# Patient Record
Sex: Female | Born: 1960 | Race: White | Hispanic: No | Marital: Single | Smoking: Current every day smoker
Health system: Southern US, Community
[De-identification: ages and names within clinical notes are randomized; demographics above are authoritative.]

## PROBLEM LIST (undated history)

## (undated) DIAGNOSIS — I10 Essential (primary) hypertension: Secondary | ICD-10-CM

## (undated) DIAGNOSIS — I639 Cerebral infarction, unspecified: Secondary | ICD-10-CM

## (undated) DIAGNOSIS — F1410 Cocaine abuse, uncomplicated: Secondary | ICD-10-CM

## (undated) DIAGNOSIS — I219 Acute myocardial infarction, unspecified: Secondary | ICD-10-CM

## (undated) DIAGNOSIS — I739 Peripheral vascular disease, unspecified: Secondary | ICD-10-CM

## (undated) DIAGNOSIS — K559 Vascular disorder of intestine, unspecified: Secondary | ICD-10-CM

## (undated) DIAGNOSIS — I2510 Atherosclerotic heart disease of native coronary artery without angina pectoris: Secondary | ICD-10-CM

## (undated) DIAGNOSIS — I509 Heart failure, unspecified: Secondary | ICD-10-CM

## (undated) HISTORY — PX: EXPLORATORY LAPAROTOMY: SUR591

---

## 2014-08-10 ENCOUNTER — Inpatient Hospital Stay
Admission: AD | Admit: 2014-08-10 | Discharge: 2014-08-11 | Payer: Medicare Other | Source: Ambulatory Visit | Attending: Internal Medicine | Admitting: Internal Medicine

## 2014-08-10 DIAGNOSIS — Z789 Other specified health status: Secondary | ICD-10-CM

## 2014-08-10 DIAGNOSIS — K668 Other specified disorders of peritoneum: Secondary | ICD-10-CM

## 2014-08-11 ENCOUNTER — Inpatient Hospital Stay
Admission: RE | Admit: 2014-08-11 | Discharge: 2014-08-26 | Disposition: A | Discharge status: Nursing Home (Includes ICF) | Payer: Medicare Other | Attending: Emergency Medicine | Admitting: Emergency Medicine

## 2014-08-11 ENCOUNTER — Emergency Department (HOSPITAL_COMMUNITY): Payer: Medicare Other

## 2014-08-11 ENCOUNTER — Encounter (HOSPITAL_COMMUNITY): Payer: Self-pay | Admitting: Emergency Medicine

## 2014-08-11 ENCOUNTER — Other Ambulatory Visit (HOSPITAL_COMMUNITY): Payer: Medicare Other

## 2014-08-11 ENCOUNTER — Emergency Department (HOSPITAL_COMMUNITY)
Admission: EM | Admit: 2014-08-11 | Discharge: 2014-08-11 | Disposition: A | Discharge status: Rehabilitation Facility | Payer: Medicare Other | Attending: Emergency Medicine | Admitting: Emergency Medicine

## 2014-08-11 DIAGNOSIS — J9600 Acute respiratory failure, unspecified whether with hypoxia or hypercapnia: Secondary | ICD-10-CM

## 2014-08-11 DIAGNOSIS — Z730 Burn-out: Secondary | ICD-10-CM | POA: Insufficient documentation

## 2014-08-11 DIAGNOSIS — Z9889 Other specified postprocedural states: Secondary | ICD-10-CM | POA: Insufficient documentation

## 2014-08-11 DIAGNOSIS — K668 Other specified disorders of peritoneum: Secondary | ICD-10-CM | POA: Insufficient documentation

## 2014-08-11 DIAGNOSIS — J9690 Respiratory failure, unspecified, unspecified whether with hypoxia or hypercapnia: Secondary | ICD-10-CM

## 2014-08-11 DIAGNOSIS — I2510 Atherosclerotic heart disease of native coronary artery without angina pectoris: Secondary | ICD-10-CM | POA: Diagnosis not present

## 2014-08-11 DIAGNOSIS — G931 Anoxic brain damage, not elsewhere classified: Secondary | ICD-10-CM

## 2014-08-11 DIAGNOSIS — G40901 Epilepsy, unspecified, not intractable, with status epilepticus: Secondary | ICD-10-CM

## 2014-08-11 DIAGNOSIS — K56609 Unspecified intestinal obstruction, unspecified as to partial versus complete obstruction: Secondary | ICD-10-CM

## 2014-08-11 DIAGNOSIS — R630 Anorexia: Secondary | ICD-10-CM | POA: Diagnosis not present

## 2014-08-11 DIAGNOSIS — J111 Influenza due to unidentified influenza virus with other respiratory manifestations: Secondary | ICD-10-CM

## 2014-08-11 DIAGNOSIS — G8918 Other acute postprocedural pain: Secondary | ICD-10-CM | POA: Diagnosis present

## 2014-08-11 DIAGNOSIS — Z8673 Personal history of transient ischemic attack (TIA), and cerebral infarction without residual deficits: Secondary | ICD-10-CM | POA: Diagnosis not present

## 2014-08-11 DIAGNOSIS — Z452 Encounter for adjustment and management of vascular access device: Secondary | ICD-10-CM

## 2014-08-11 DIAGNOSIS — I10 Essential (primary) hypertension: Secondary | ICD-10-CM | POA: Diagnosis not present

## 2014-08-11 DIAGNOSIS — I509 Heart failure, unspecified: Secondary | ICD-10-CM | POA: Insufficient documentation

## 2014-08-11 DIAGNOSIS — Z95828 Presence of other vascular implants and grafts: Secondary | ICD-10-CM

## 2014-08-11 DIAGNOSIS — I469 Cardiac arrest, cause unspecified: Secondary | ICD-10-CM

## 2014-08-11 DIAGNOSIS — Z4659 Encounter for fitting and adjustment of other gastrointestinal appliance and device: Secondary | ICD-10-CM

## 2014-08-11 DIAGNOSIS — Z01818 Encounter for other preprocedural examination: Secondary | ICD-10-CM

## 2014-08-11 HISTORY — DX: Peripheral vascular disease, unspecified: I73.9

## 2014-08-11 HISTORY — DX: Heart failure, unspecified: I50.9

## 2014-08-11 HISTORY — DX: Cocaine abuse, uncomplicated: F14.10

## 2014-08-11 HISTORY — DX: Atherosclerotic heart disease of native coronary artery without angina pectoris: I25.10

## 2014-08-11 HISTORY — DX: Vascular disorder of intestine, unspecified: K55.9

## 2014-08-11 HISTORY — DX: Cerebral infarction, unspecified: I63.9

## 2014-08-11 HISTORY — DX: Essential (primary) hypertension: I10

## 2014-08-11 LAB — PROTIME-INR
INR: 1.12 (ref 0.00–1.49)
Prothrombin Time: 14.5 seconds (ref 11.6–15.2)

## 2014-08-11 LAB — CBC WITH DIFFERENTIAL/PLATELET
BASOS ABS: 0 10*3/uL (ref 0.0–0.1)
BASOS PCT: 0 % (ref 0–1)
Basophils Absolute: 0 10*3/uL (ref 0.0–0.1)
Basophils Relative: 0 % (ref 0–1)
Eosinophils Absolute: 0 10*3/uL (ref 0.0–0.7)
Eosinophils Absolute: 0 10*3/uL (ref 0.0–0.7)
Eosinophils Relative: 0 % (ref 0–5)
Eosinophils Relative: 0 % (ref 0–5)
HCT: 29.9 % — ABNORMAL LOW (ref 36.0–46.0)
HCT: 24.1 % — ABNORMAL LOW (ref 36.0–46.0)
Hemoglobin: 9.9 g/dL — ABNORMAL LOW (ref 12.0–15.0)
Hemoglobin: 8.2 g/dL — ABNORMAL LOW (ref 12.0–15.0)
LYMPHS PCT: 9 % — AB (ref 12–46)
Lymphocytes Relative: 11 % — ABNORMAL LOW (ref 12–46)
Lymphs Abs: 1.1 10*3/uL (ref 0.7–4.0)
Lymphs Abs: 1.1 10*3/uL (ref 0.7–4.0)
MCH: 29.3 pg (ref 26.0–34.0)
MCH: 30.3 pg (ref 26.0–34.0)
MCHC: 33.1 g/dL (ref 30.0–36.0)
MCHC: 34 g/dL (ref 30.0–36.0)
MCV: 88.5 fL (ref 78.0–100.0)
MCV: 88.9 fL (ref 78.0–100.0)
MONO ABS: 0.4 10*3/uL (ref 0.1–1.0)
Monocytes Absolute: 0.5 10*3/uL (ref 0.1–1.0)
Monocytes Relative: 3 % (ref 3–12)
Monocytes Relative: 5 % (ref 3–12)
NEUTROS ABS: 10.8 10*3/uL — AB (ref 1.7–7.7)
Neutro Abs: 7.8 10*3/uL — ABNORMAL HIGH (ref 1.7–7.7)
Neutrophils Relative %: 88 % — ABNORMAL HIGH (ref 43–77)
Neutrophils Relative %: 83 % — ABNORMAL HIGH (ref 43–77)
PLATELETS: 403 10*3/uL — AB (ref 150–400)
Platelets: 353 10*3/uL (ref 150–400)
RBC: 3.38 MIL/uL — ABNORMAL LOW (ref 3.87–5.11)
RBC: 2.71 MIL/uL — ABNORMAL LOW (ref 3.87–5.11)
RDW: 14.2 % (ref 11.5–15.5)
RDW: 14.3 % (ref 11.5–15.5)
WBC: 12.3 10*3/uL — ABNORMAL HIGH (ref 4.0–10.5)
WBC: 9.4 10*3/uL (ref 4.0–10.5)

## 2014-08-11 LAB — COMPREHENSIVE METABOLIC PANEL
ALBUMIN: 1.5 g/dL — AB (ref 3.5–5.2)
ALT: 28 U/L (ref 0–35)
ALT: 25 U/L (ref 0–35)
AST: 25 U/L (ref 0–37)
AST: 25 U/L (ref 0–37)
Albumin: 1.3 g/dL — ABNORMAL LOW (ref 3.5–5.2)
Alkaline Phosphatase: 48 U/L (ref 39–117)
Alkaline Phosphatase: 42 U/L (ref 39–117)
Anion gap: 12 (ref 5–15)
Anion gap: 11 (ref 5–15)
BUN: 15 mg/dL (ref 6–23)
BUN: 14 mg/dL (ref 6–23)
CO2: 25 meq/L (ref 19–32)
CO2: 24 mEq/L (ref 19–32)
CREATININE: 0.77 mg/dL (ref 0.50–1.10)
Calcium: 7.9 mg/dL — ABNORMAL LOW (ref 8.4–10.5)
Calcium: 7.5 mg/dL — ABNORMAL LOW (ref 8.4–10.5)
Chloride: 101 mEq/L (ref 96–112)
Chloride: 102 mEq/L (ref 96–112)
Creatinine, Ser: 0.69 mg/dL (ref 0.50–1.10)
GFR calc Af Amer: >90 mL/min (ref >90)
GFR calc Af Amer: >90 mL/min (ref >90)
GFR calc non Af Amer: >90 mL/min (ref >90)
Glucose, Bld: 103 mg/dL — ABNORMAL HIGH (ref 70–99)
Glucose, Bld: 68 mg/dL — ABNORMAL LOW (ref 70–99)
Potassium: 2.8 mEq/L — CL (ref 3.7–5.3)
Potassium: 2.9 mEq/L — CL (ref 3.7–5.3)
SODIUM: 138 meq/L (ref 137–147)
Sodium: 137 mEq/L (ref 137–147)
Total Bilirubin: 0.3 mg/dL (ref 0.3–1.2)
Total Bilirubin: 0.3 mg/dL (ref 0.3–1.2)
Total Protein: 4.6 g/dL — ABNORMAL LOW (ref 6.0–8.3)
Total Protein: 4.2 g/dL — ABNORMAL LOW (ref 6.0–8.3)

## 2014-08-11 LAB — VITAMIN B12: Vitamin B-12: 834 pg/mL (ref 211–911)

## 2014-08-11 LAB — LIPID PANEL
CHOL/HDL RATIO: 4.3 ratio
Cholesterol: 82 mg/dL (ref 0–200)
HDL: 19 mg/dL — ABNORMAL LOW (ref >39)
LDL Cholesterol: 39 mg/dL (ref 0–99)
Triglycerides: 121 mg/dL (ref <150)
VLDL: 24 mg/dL (ref 0–40)

## 2014-08-11 LAB — IRON AND TIBC
Iron: <10 ug/dL — ABNORMAL LOW (ref 42–135)
UIBC: 134 ug/dL (ref 125–400)

## 2014-08-11 LAB — T4, FREE: FREE T4: 0.96 ng/dL (ref 0.80–1.80)

## 2014-08-11 LAB — PHOSPHORUS: Phosphorus: 3.2 mg/dL (ref 2.3–4.6)

## 2014-08-11 LAB — BLOOD GAS, ARTERIAL
Acid-Base Excess: 2.5 mmol/L — ABNORMAL HIGH (ref 0.0–2.0)
BICARBONATE: 25 meq/L — AB (ref 20.0–24.0)
DRAWN BY: 27022
O2 CONTENT: 4 L/min
O2 SAT: 96.7 %
PCO2 ART: 29 mmHg — AB (ref 35.0–45.0)
Patient temperature: 98.6
TCO2: 25.9 mmol/L (ref 0–100)
pH, Arterial: 7.545 — ABNORMAL HIGH (ref 7.350–7.450)
pO2, Arterial: 94.8 mmHg (ref 80.0–100.0)

## 2014-08-11 LAB — MAGNESIUM: MAGNESIUM: 1.7 mg/dL (ref 1.5–2.5)

## 2014-08-11 LAB — FERRITIN: Ferritin: 181 ng/mL (ref 10–291)

## 2014-08-11 LAB — PRO B NATRIURETIC PEPTIDE: Pro B Natriuretic peptide (BNP): 3495 pg/mL — ABNORMAL HIGH (ref 0–125)

## 2014-08-11 LAB — TSH: TSH: 4.57 u[IU]/mL — AB (ref 0.350–4.500)

## 2014-08-11 LAB — HEMOGLOBIN A1C
Hgb A1c MFr Bld: 5.5 % (ref <5.7)
Mean Plasma Glucose: 111 mg/dL (ref <117)

## 2014-08-11 MED ORDER — FENTANYL CITRATE 0.05 MG/ML IJ SOLN: 100.0000 ug | Freq: Once | INTRAMUSCULAR | Status: DC

## 2014-08-11 MED ORDER — ONDANSETRON HCL 4 MG/2ML IJ SOLN
4.0000 mg | Freq: Once | INTRAMUSCULAR | Status: AC
  Administered 2014-08-11: 4 mg via INTRAVENOUS
  Filled 2014-08-11: qty 2

## 2014-08-11 MED ORDER — POTASSIUM CHLORIDE 10 MEQ/100ML IV SOLN
10.0000 meq | INTRAVENOUS | Status: AC
  Administered 2014-08-11 (×3): 10 meq via INTRAVENOUS
  Filled 2014-08-11 (×4): qty 100

## 2014-08-11 MED ORDER — MORPHINE SULFATE 4 MG/ML IJ SOLN
4.0000 mg | Freq: Once | INTRAMUSCULAR | Status: AC
  Administered 2014-08-11: 4 mg via INTRAVENOUS
  Filled 2014-08-11: qty 1

## 2014-08-11 MED ORDER — SODIUM CHLORIDE 0.9 % IV BOLUS (SEPSIS)
500.0000 mL | Freq: Once | INTRAVENOUS | Status: AC
  Administered 2014-08-11: 500 mL via INTRAVENOUS

## 2014-08-11 NOTE — ED Notes (Signed)
RN to bedside to round, noticed pt gown wet. Upon inspection, found CVC catheter lying on pt chest. Sutures intact, line intact, dressing missing. Pt unaware of pulling line out. MD made aware. No active bleeding noted. Pt denies any pain.

## 2014-08-11 NOTE — Consult Note (Signed)
Reason for Consult:recent ex lap at outside hospital and free air on CT scan Referring Physician: EDP  Alyssa Gray is an 53 y.o. female.  HPI: Asked to see pt sent from [facility] after laparotomy done 1 week ago at outside hospital.  No communication from [facility] to surgery here and pt transferred to ED without accepting MD due to free air on an admission abdominal film.  Pt had surgery at [facility] but no records to review.  CT ordered by [facility] of abdomen and pelvis which shows free air but no contrast and poor quality.  Pt is a poor historian and denies abdominal pain and is hungry. She cannot give me any history and has no idea what was done to her abdomen. Pt denies abdominal pain nausea or vomiting.   Past Medical History  Diagnosis Date  . Coronary artery disease   . CHF (congestive heart failure)   . Hypertension   . Cocaine abuse   . Stroke     TIAs  . Mesenteric ischemia   . Peripheral arterial disease     History reviewed. No pertinent past surgical history.  History reviewed. No pertinent family history.  Social History:  reports that she has been smoking.  She does not have any smokeless tobacco history on file. She reports that she drinks alcohol. She reports that she uses illicit drugs (Cocaine).  Allergies: No Known Allergies  Medications: I have reviewed the patient's current medications.  Results for orders placed or performed during the hospital encounter of 08/11/14 (from the past 48 hour(s))  CBC with Differential     Status: Abnormal   Collection Time: 08/11/14  4:20 PM  Result Value Ref Range   WBC 9.4 4.0 - 10.5 K/uL   RBC 2.71 (L) 3.87 - 5.11 MIL/uL   Hemoglobin 8.2 (L) 12.0 - 15.0 g/dL    Comment: DELTA CHECK NOTED REPEATED TO VERIFY    HCT 24.1 (L) 36.0 - 46.0 %   MCV 88.9 78.0 - 100.0 fL   MCH 30.3 26.0 - 34.0 pg   MCHC 34.0 30.0 - 36.0 g/dL   RDW 14.3 11.5 - 15.5 %   Platelets 353 150 - 400 K/uL   Neutrophils  Relative % 83 (H) 43 - 77 %   Neutro Abs 7.8 (H) 1.7 - 7.7 K/uL   Lymphocytes Relative 11 (L) 12 - 46 %   Lymphs Abs 1.1 0.7 - 4.0 K/uL   Monocytes Relative 5 3 - 12 %   Monocytes Absolute 0.5 0.1 - 1.0 K/uL   Eosinophils Relative 0 0 - 5 %   Eosinophils Absolute 0.0 0.0 - 0.7 K/uL   Basophils Relative 0 0 - 1 %   Basophils Absolute 0.0 0.0 - 0.1 K/uL  Comprehensive metabolic panel     Status: Abnormal   Collection Time: 08/11/14  4:20 PM  Result Value Ref Range   Sodium 137 137 - 147 mEq/L   Potassium 2.9 (LL) 3.7 - 5.3 mEq/L    Comment: CRITICAL RESULT CALLED TO, READ BACK BY AND VERIFIED WITH: G [facility] 1718 08/11/14 WBOND    Chloride 102 96 - 112 mEq/L   CO2 24 19 - 32 mEq/L   Glucose, Bld 68 (L) 70 - 99 mg/dL   BUN 14 6 - 23 mg/dL   Creatinine, Ser 0.69 0.50 - 1.10 mg/dL   Calcium 7.5 (L) 8.4 - 10.5 mg/dL   Total Protein 4.2 (L) 6.0 - 8.3 g/dL  Albumin 1.3 (L) 3.5 - 5.2 g/dL   AST 25 0 - 37 U/L   ALT 25 0 - 35 U/L   Alkaline Phosphatase 42 39 - 117 U/L   Total Bilirubin 0.3 0.3 - 1.2 mg/dL   GFR calc non Af Amer >90 >90 mL/min   GFR calc Af Amer >90 >90 mL/min    Comment: (NOTE) The eGFR has been calculated using the CKD EPI equation. This calculation has not been validated in all clinical situations. eGFR's persistently <90 mL/min signify possible Chronic Kidney Disease.    Anion gap 11 5 - 15    Ct Abdomen Pelvis Wo Contrast  08/11/2014   CLINICAL DATA:  Free intraperitoneal air.  EXAM: CT ABDOMEN AND PELVIS WITHOUT CONTRAST  TECHNIQUE: Multidetector CT imaging of the abdomen and pelvis was performed following the standard protocol without IV contrast.  COMPARISON:  Chest radiograph of same day.  FINDINGS: Mild to moderate bilateral pleural effusions are noted with adjacent subsegmental atelectasis. No significant osseous abnormality is noted.  Small cyst is seen laterally in the right hepatic lobe. No other focal abnormality is noted in the liver, spleen or  pancreas. Status post cholecystectomy. Atherosclerotic calcifications of abdominal aorta and iliac arteries are noted without aneurysm formation. Adrenal glands and kidneys appear normal. No hydronephrosis or renal obstruction is noted. Anastomotic sutures are seen in the region of the transverse colon consistent with history of partial colonic resection according to Dr. Nicoletta Dress. Mild to moderate ascites is noted throughout the abdomen. Uterus and ovaries appear normal. Urinary bladder is decompressed secondary to Foley catheter. Small fluid-filled right femoral hernia is noted. Mild anasarca is noted.  No significant bowel dilatation is noted, although mild wall thickening is noted diffusely throughout the proximal small bowel. This may simply be due to surrounding ascites, but diffuse inflammation cannot be excluded. Large pneumoperitoneum is noted anteriorly in epigastric region ; the patient is reportedly 8 days postop partial colonic resection. At this point, it is uncertain if this represents expected postoperative intraperitoneal gas, or if potentially there may be rupture of hollow viscus.  IMPRESSION: Mild to moderate bilateral pleural effusions are noted with adjacent subsegmental atelectasis.  Mild to moderate ascites is noted.  Mild wall thickening is noted diffusely throughout proximal small bowel without dilatation; this may be due to surrounding ascites, but inflammation cannot be excluded.  Large amount of pneumoperitoneum is seen in the epigastric region. Patient is reportedly 8 days status post partial colonic resection according to Dr. Nicoletta Dress. At this point, it is uncertain if this represents an expected amount of postoperative gas, or if it is due to rupture of hollow viscus or other postoperative complication. Critical Value/emergent results were called by telephone at the time of interpretation on 08/11/2014 at 10:42 am to Dr. Robb Matar , who verbally acknowledged these results.   Electronically Signed    By: Sabino Dick M.D.   On: 08/11/2014 10:43   Dg Chest Port 1 View  08/11/2014   CLINICAL DATA:  Central line.  EXAM: PORTABLE CHEST - 1 VIEW  COMPARISON:  None.  FINDINGS: Right central line noted with tip in cavoatrial junction. Mediastinum and hilar structures are normal. Cardiomegaly with normal pulmonary vascularity. Left lower lobe infiltrate with possible small left pleural effusion. Atelectasis left lower lobe. Mild infiltrate medial right lung base cannot be excluded. No pneumothorax. Free air under the right hemidiaphragm cannot be excluded. Abdominal series suggested for further evaluation. No acute osseus abnormality. Old lower left rib  fractures.  IMPRESSION: 1. Central line in good position. 2. Left lower lobe infiltrate with left lower lobe atelectasis. Small left pleural effusion cannot be excluded. 3. Mild infiltrate medial right lung base cannot be excluded. 4. Cardiomegaly with normal pulmonary vascularity. 5. Free air under the right hemidiaphragm cannot be excluded. Abdominal series suggested. Critical Value/emergent results were called by telephone at the time of interpretation on 08/11/2014 at 7:28 am to nurse Adame, to verbally acknowledged these results.   Electronically Signed   By: Marcello Moores  Register   On: 08/11/2014 07:33   Dg Abd Acute W/chest  08/11/2014   CLINICAL DATA:  Abdominal pain. Surgery 8 days ago with continued free air on CT.  EXAM: ACUTE ABDOMEN SERIES (ABDOMEN 2 VIEW & CHEST 1 VIEW)  COMPARISON:  CT earlier today.  FINDINGS: There is free air noted on the decubitus view as seen on CT. No evidence of bowel obstruction. Surgical sutures in the upper to mid abdomen.  There are small bilateral pleural effusions. Left base atelectasis. Right Port-A-Cath is in place with the tip in the SVC.  No acute bony abnormality. Aortic calcifications without visible aneurysm.  IMPRESSION: Moderate pneumoperitoneum as seen on prior CT. No evidence of bowel obstruction.  Left base  atelectasis.   Electronically Signed   By: Rolm Baptise M.D.   On: 08/11/2014 19:39    Review of Systems  Unable to perform ROS  Blood pressure 106/53, pulse 41, temperature 99.4 F (37.4 C), temperature source Rectal, resp. rate 18, height 5' 5"  (1.651 m), weight 120 lb (54.432 kg), SpO2 92 %. Physical Exam  Constitutional: She is oriented to person, place, and time. No distress.  HENT:  Head: Normocephalic and atraumatic.  Eyes: Pupils are equal, round, and reactive to light. No scleral icterus.  Neck: Normal range of motion.  Cardiovascular: Normal rate and regular rhythm.   Respiratory: Effort normal and breath sounds normal.  GI: Soft. She exhibits no distension. There is no tenderness. There is no rebound and no guarding.    Musculoskeletal: Normal range of motion.  Neurological: She is alert and oriented to person, place, and time.  Skin: Skin is warm and dry. She is not diaphoretic.  Psychiatric: Her mood appears anxious. Her speech is rapid and/or pressured. She is slowed. Cognition and memory are impaired. She expresses impulsivity.    Assessment/Plan: Free air on CT and plain films of unknown  significance.  Her  Exam is benign and incision looks older than 1 week as stated.  Will need records from [facility] but currently has a non acute abdomen.  May be best for admission to medicine overnight to follow exam in am. Ok to give clears. If condition worsens,  Would need ex lap.     Shun Pletz A. 08/11/2014, 8:34 PM

## 2014-08-11 NOTE — ED Notes (Signed)
Report given to [place], Juanda Crumble received report.

## 2014-08-11 NOTE — ED Notes (Signed)
[facility] just [facility] for rehab for perforated bowel surgery on the 17th. Noted to have large amount of free air in abdomen on admission flat plate xray. Sent here for surgical consult and eval. Pt arouses to voice, VSS, moans in pain.

## 2014-08-11 NOTE — ED Notes (Signed)
CRITICAL VALUE ALERT  Critical value received:  K+ 2.9  Date of notification:  08/11/14  Time of notification:  1720  Critical value read back:Yes.    Nurse who received alert:  Chester Holstein, RN  MD notified (1st page):  Ralene Bathe, MD  Time of first page:  1720  MD notified (2nd page):  Time of second page:  Responding MD:  Ralene Bathe, MD  Time MD responded:  [phone]

## 2014-08-11 NOTE — Discharge Instructions (Signed)
Return here as needed. This free air is most likely from her previous surgical procedure.

## 2014-08-11 NOTE — ED Provider Notes (Signed)
CSN: [id number]     Arrival date & time 08/11/14  1430 History   First MD Initiated Contact with Patient 08/11/14 1443     Chief Complaint  Patient presents with  . Post-op Problem    HPI: Alyssa Gray is a 53 year old Caucasian female with PMH of CAD, CHF, HTN, TIA's and Substance Abuse (Cocaine Use) who presents to the ED on 08/11/2014 from [facility] for abdominal pain and concern for possible perforated bowel. Patient reports her current abdominal pain is "sharp" and is 9/10, most prominent along her midline and surgical incision area. She says the pain has been present for several days and the pain has been consistent since it started. The patient is very vague about her history and past surgical encounter. According to medical records, she was admitted to [facility] on 08/02/2014 for abdominal distension and not being able to eat or drink without having intense pain. 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 She was recently transferred to [facility] on 08/10/2014 and a CT was ordered today due to her ongoing abdominal pain. CT Imaging showed a large amount of pneumoperitoneum and she was sent to the ED to be evaluated to see if this was post-operative gas or possible rupture of hollow viscus.   Past Medical History  Diagnosis Date  . Coronary artery disease   . CHF (congestive heart failure)   . Hypertension   . Cocaine abuse   . Stroke     TIAs  . Mesenteric ischemia   . Peripheral arterial disease    History reviewed. No pertinent past surgical history. History reviewed. No pertinent family history. History  Substance Use Topics  . Smoking status: Current Every Day Smoker -- 1.00 packs/day  . Smokeless tobacco: Not on file  . Alcohol Use: Yes   OB History    No data available     Review of Systems  Constitutional: Positive for  appetite change. Negative for fever, chills and diaphoresis.  HENT: Negative for congestion, facial swelling, nosebleeds, rhinorrhea, sinus pressure, sore throat and trouble swallowing.   Eyes: Negative for visual disturbance.  Respiratory: Negative for cough, chest tightness, shortness of breath, wheezing and stridor.   Cardiovascular: Negative for chest pain, palpitations and leg swelling.  Gastrointestinal: Positive for nausea and abdominal pain. Negative for vomiting, diarrhea, constipation, blood in stool, abdominal distention and anal bleeding.  Genitourinary: Negative for dysuria, urgency, hematuria, flank pain and difficulty urinating.  Musculoskeletal: Negative for neck pain and neck stiffness.  Skin: Negative for color change, pallor and rash.  Neurological: Negative for dizziness, tremors, syncope, light-headedness and headaches.  Hematological: Negative for adenopathy. Does not bruise/bleed easily.      Allergies  Review of patient's allergies indicates no known allergies.  Home Medications   Prior to Admission medications   Not on File   BP 115/54 mmHg  Pulse 41  Temp(Src) 99.4 F (37.4 C) (Rectal)  Resp 18  Ht 5\' 5"  (1.651 m)  Wt 120 lb (54.432 kg)  BMI 19.97 kg/m2  SpO2 99% Physical Exam  Constitutional: She is oriented to person, place, and time. She appears well-developed and well-nourished.  HENT:  Head: Normocephalic and atraumatic.  Mouth/Throat: Oropharynx is clear and moist. No oropharyngeal exudate.  Eyes: Conjunctivae and EOM are normal. Pupils are equal, round, and reactive to light.  Neck: Normal range of motion. Neck supple. No  thyromegaly present.  Cardiovascular: Normal rate, regular rhythm, normal heart sounds and intact distal pulses.  Exam reveals no gallop and no friction rub.   No murmur heard. Pulmonary/Chest: Effort normal and breath sounds normal. No stridor. No respiratory distress. She has no wheezes. She has no rales. She exhibits no  tenderness.  Abdominal: Soft. Bowel sounds are normal. She exhibits no distension and no mass. There is tenderness. There is no rebound and no guarding.  Incision noted along midline, without erythema. Tenderness most notable surrounding incision.  Musculoskeletal: She exhibits no edema or tenderness.  Lymphadenopathy:    She has no cervical adenopathy.  Neurological: She is alert and oriented to person, place, and time.  Skin: Skin is warm and dry. No rash noted. No erythema.  Nursing note and vitals reviewed.   ED Course  Procedures (including critical care time) Labs Review Labs Reviewed  CBC WITH DIFFERENTIAL - Abnormal; Notable for the following:    RBC 2.71 (*)    Hemoglobin 8.2 (*)    HCT 24.1 (*)    Neutrophils Relative % 83 (*)    Neutro Abs 7.8 (*)    Lymphocytes Relative 11 (*)    All other components within normal limits  COMPREHENSIVE METABOLIC PANEL - Abnormal; Notable for the following:    Potassium 2.9 (*)    Glucose, Bld 68 (*)    Calcium 7.5 (*)    Total Protein 4.2 (*)    Albumin 1.3 (*)    All other components within normal limits    Imaging Review Ct Abdomen Pelvis Wo Contrast  08/11/2014   CLINICAL DATA:  Free intraperitoneal air.  EXAM: CT ABDOMEN AND PELVIS WITHOUT CONTRAST  TECHNIQUE: Multidetector CT imaging of the abdomen and pelvis was performed following the standard protocol without IV contrast.  COMPARISON:  Chest radiograph of same day.  FINDINGS: Mild to moderate bilateral pleural effusions are noted with adjacent subsegmental atelectasis. No significant osseous abnormality is noted.  Small cyst is seen laterally in the right hepatic lobe. No other focal abnormality is noted in the liver, spleen or pancreas. Status post cholecystectomy. Atherosclerotic calcifications of abdominal aorta and iliac arteries are noted without aneurysm formation. Adrenal glands and kidneys appear normal. No hydronephrosis or renal obstruction is noted. Anastomotic  sutures are seen in the region of the transverse colon consistent with history of partial colonic resection according to Dr. Nicoletta Dress. Mild to moderate ascites is noted throughout the abdomen. Uterus and ovaries appear normal. Urinary bladder is decompressed secondary to Foley catheter. Small fluid-filled right femoral hernia is noted. Mild anasarca is noted.  No significant bowel dilatation is noted, although mild wall thickening is noted diffusely throughout the proximal small bowel. This may simply be due to surrounding ascites, but diffuse inflammation cannot be excluded. Large pneumoperitoneum is noted anteriorly in epigastric region ; the patient is reportedly 8 days postop partial colonic resection. At this point, it is uncertain if this represents expected postoperative intraperitoneal gas, or if potentially there may be rupture of hollow viscus.  IMPRESSION: Mild to moderate bilateral pleural effusions are noted with adjacent subsegmental atelectasis.  Mild to moderate ascites is noted.  Mild wall thickening is noted diffusely throughout proximal small bowel without dilatation; this may be due to surrounding ascites, but inflammation cannot be excluded.  Large amount of pneumoperitoneum is seen in the epigastric region. Patient is reportedly 8 days status post partial colonic resection according to Dr. Nicoletta Dress. At this point, it is uncertain if this represents  an expected amount of postoperative gas, or if it is due to rupture of hollow viscus or other postoperative complication. Critical Value/emergent results were called by telephone at the time of interpretation on 08/11/2014 at 10:42 am to Dr. Robb Matar , who verbally acknowledged these results.   Electronically Signed   By: Sabino Dick M.D.   On: 08/11/2014 10:43   Dg Chest Port 1 View  08/11/2014   CLINICAL DATA:  Central line.  EXAM: PORTABLE CHEST - 1 VIEW  COMPARISON:  None.  FINDINGS: Right central line noted with tip in cavoatrial junction. Mediastinum  and hilar structures are normal. Cardiomegaly with normal pulmonary vascularity. Left lower lobe infiltrate with possible small left pleural effusion. Atelectasis left lower lobe. Mild infiltrate medial right lung base cannot be excluded. No pneumothorax. Free air under the right hemidiaphragm cannot be excluded. Abdominal series suggested for further evaluation. No acute osseus abnormality. Old lower left rib fractures.  IMPRESSION: 1. Central line in good position. 2. Left lower lobe infiltrate with left lower lobe atelectasis. Small left pleural effusion cannot be excluded. 3. Mild infiltrate medial right lung base cannot be excluded. 4. Cardiomegaly with normal pulmonary vascularity. 5. Free air under the right hemidiaphragm cannot be excluded. Abdominal series suggested. Critical Value/emergent results were called by telephone at the time of interpretation on 08/11/2014 at 7:28 am to nurse Adame, to verbally acknowledged these results.   Electronically Signed   By: Marcello Moores  Register   On: 08/11/2014 07:33    I spoke with general surgery and the [facility].  They both evaluated the patient and felt that this is pneumoperitoneum is related to her previous surgery.  [facility]  Told to return here for any worsening in her condition.    Brent General, PA-C 08/12/14 0100  Quintella Reichert, MD 08/12/14 1500

## 2014-08-12 ENCOUNTER — Other Ambulatory Visit (HOSPITAL_COMMUNITY): Payer: Medicare Other

## 2014-08-12 LAB — CBC WITH DIFFERENTIAL/PLATELET
BASOS PCT: 0 % (ref 0–1)
Basophils Absolute: 0 10*3/uL (ref 0.0–0.1)
EOS ABS: 0.1 10*3/uL (ref 0.0–0.7)
Eosinophils Relative: 1 % (ref 0–5)
HCT: 27.2 % — ABNORMAL LOW (ref 36.0–46.0)
Hemoglobin: 9.2 g/dL — ABNORMAL LOW (ref 12.0–15.0)
Lymphocytes Relative: 13 % (ref 12–46)
Lymphs Abs: 1.2 10*3/uL (ref 0.7–4.0)
MCH: 30.8 pg (ref 26.0–34.0)
MCHC: 33.8 g/dL (ref 30.0–36.0)
MCV: 91 fL (ref 78.0–100.0)
Monocytes Absolute: 0.7 10*3/uL (ref 0.1–1.0)
Monocytes Relative: 7 % (ref 3–12)
NEUTROS PCT: 79 % — AB (ref 43–77)
Neutro Abs: 7.8 10*3/uL — ABNORMAL HIGH (ref 1.7–7.7)
Platelets: 384 10*3/uL (ref 150–400)
RBC: 2.99 MIL/uL — ABNORMAL LOW (ref 3.87–5.11)
RDW: 14.8 % (ref 11.5–15.5)
WBC: 9.8 10*3/uL (ref 4.0–10.5)

## 2014-08-12 LAB — BASIC METABOLIC PANEL
ANION GAP: 10 (ref 5–15)
BUN: 14 mg/dL (ref 6–23)
CO2: 24 mEq/L (ref 19–32)
Calcium: 7.7 mg/dL — ABNORMAL LOW (ref 8.4–10.5)
Chloride: 103 mEq/L (ref 96–112)
Creatinine, Ser: 0.67 mg/dL (ref 0.50–1.10)
Glucose, Bld: 70 mg/dL (ref 70–99)
POTASSIUM: 3.6 meq/L — AB (ref 3.7–5.3)
SODIUM: 137 meq/L (ref 137–147)

## 2014-08-12 LAB — FOLATE RBC: RBC Folate: 673 ng/mL — ABNORMAL HIGH (ref >280)

## 2014-08-12 LAB — PHOSPHORUS: Phosphorus: 3.1 mg/dL (ref 2.3–4.6)

## 2014-08-12 LAB — MAGNESIUM: MAGNESIUM: 1.7 mg/dL (ref 1.5–2.5)

## 2014-08-14 LAB — BASIC METABOLIC PANEL
ANION GAP: 12 (ref 5–15)
BUN: 10 mg/dL (ref 6–23)
CO2: 22 mEq/L (ref 19–32)
Calcium: 7.6 mg/dL — ABNORMAL LOW (ref 8.4–10.5)
Chloride: 101 mEq/L (ref 96–112)
Creatinine, Ser: 0.6 mg/dL (ref 0.50–1.10)
GFR calc non Af Amer: >90 mL/min (ref >90)
Glucose, Bld: 138 mg/dL — ABNORMAL HIGH (ref 70–99)
POTASSIUM: 2.6 meq/L — AB (ref 3.7–5.3)
Sodium: 135 mEq/L — ABNORMAL LOW (ref 137–147)

## 2014-08-14 LAB — CBC
HCT: 26.2 % — ABNORMAL LOW (ref 36.0–46.0)
Hemoglobin: 8.9 g/dL — ABNORMAL LOW (ref 12.0–15.0)
MCH: 29.7 pg (ref 26.0–34.0)
MCHC: 34 g/dL (ref 30.0–36.0)
MCV: 87.3 fL (ref 78.0–100.0)
PLATELETS: 404 10*3/uL — AB (ref 150–400)
RBC: 3 MIL/uL — ABNORMAL LOW (ref 3.87–5.11)
RDW: 14.4 % (ref 11.5–15.5)
WBC: 12.5 10*3/uL — ABNORMAL HIGH (ref 4.0–10.5)

## 2014-08-14 LAB — MAGNESIUM: MAGNESIUM: 1.9 mg/dL (ref 1.5–2.5)

## 2014-08-14 LAB — POTASSIUM: Potassium: 3.8 mEq/L (ref 3.7–5.3)

## 2014-08-14 LAB — CLOSTRIDIUM DIFFICILE BY PCR: Toxigenic C. Difficile by PCR: NEGATIVE

## 2014-08-15 ENCOUNTER — Other Ambulatory Visit (HOSPITAL_COMMUNITY): Payer: Medicare Other

## 2014-08-15 LAB — CBC WITH DIFFERENTIAL/PLATELET
BASOS PCT: 0 % (ref 0–1)
Basophils Absolute: 0 10*3/uL (ref 0.0–0.1)
Eosinophils Absolute: 0.3 10*3/uL (ref 0.0–0.7)
Eosinophils Relative: 3 % (ref 0–5)
HCT: 25.8 % — ABNORMAL LOW (ref 36.0–46.0)
Hemoglobin: 8.6 g/dL — ABNORMAL LOW (ref 12.0–15.0)
Lymphocytes Relative: 13 % (ref 12–46)
Lymphs Abs: 1.1 10*3/uL (ref 0.7–4.0)
MCH: 29.6 pg (ref 26.0–34.0)
MCHC: 33.3 g/dL (ref 30.0–36.0)
MCV: 88.7 fL (ref 78.0–100.0)
MONOS PCT: 11 % (ref 3–12)
Monocytes Absolute: 1 10*3/uL (ref 0.1–1.0)
NEUTROS PCT: 73 % (ref 43–77)
Neutro Abs: 6.6 10*3/uL (ref 1.7–7.7)
PLATELETS: 455 10*3/uL — AB (ref 150–400)
RBC: 2.91 MIL/uL — ABNORMAL LOW (ref 3.87–5.11)
RDW: 14.8 % (ref 11.5–15.5)
WBC: 8.9 10*3/uL (ref 4.0–10.5)

## 2014-08-15 LAB — BASIC METABOLIC PANEL
Anion gap: 12 (ref 5–15)
BUN: 9 mg/dL (ref 6–23)
CO2: 17 mEq/L — ABNORMAL LOW (ref 19–32)
Calcium: 7.8 mg/dL — ABNORMAL LOW (ref 8.4–10.5)
Chloride: 106 mEq/L (ref 96–112)
Creatinine, Ser: 0.53 mg/dL (ref 0.50–1.10)
Glucose, Bld: 112 mg/dL — ABNORMAL HIGH (ref 70–99)
Potassium: 3.6 mEq/L — ABNORMAL LOW (ref 3.7–5.3)
SODIUM: 135 meq/L — AB (ref 137–147)

## 2014-08-15 LAB — STOOL CULTURE

## 2014-08-15 LAB — PROCALCITONIN: Procalcitonin: 0.19 ng/mL

## 2014-08-16 LAB — CBC
HCT: 23.9 % — ABNORMAL LOW (ref 36.0–46.0)
Hemoglobin: 7.8 g/dL — ABNORMAL LOW (ref 12.0–15.0)
MCH: 28.9 pg (ref 26.0–34.0)
MCHC: 32.6 g/dL (ref 30.0–36.0)
MCV: 88.5 fL (ref 78.0–100.0)
Platelets: 467 10*3/uL — ABNORMAL HIGH (ref 150–400)
RBC: 2.7 MIL/uL — ABNORMAL LOW (ref 3.87–5.11)
RDW: 14.9 % (ref 11.5–15.5)
WBC: 6.9 10*3/uL (ref 4.0–10.5)

## 2014-08-16 LAB — BASIC METABOLIC PANEL
ANION GAP: 12 (ref 5–15)
BUN: 8 mg/dL (ref 6–23)
CALCIUM: 7.7 mg/dL — AB (ref 8.4–10.5)
CO2: 16 mEq/L — ABNORMAL LOW (ref 19–32)
CREATININE: 0.58 mg/dL (ref 0.50–1.10)
Chloride: 109 mEq/L (ref 96–112)
Glucose, Bld: 92 mg/dL (ref 70–99)
Potassium: 3.7 mEq/L (ref 3.7–5.3)
Sodium: 137 mEq/L (ref 137–147)

## 2014-08-17 LAB — BASIC METABOLIC PANEL
ANION GAP: 12 (ref 5–15)
BUN: 9 mg/dL (ref 6–23)
CALCIUM: 7.9 mg/dL — AB (ref 8.4–10.5)
CO2: 16 mEq/L — ABNORMAL LOW (ref 19–32)
CREATININE: 0.55 mg/dL (ref 0.50–1.10)
Chloride: 104 mEq/L (ref 96–112)
Glucose, Bld: 88 mg/dL (ref 70–99)
Potassium: 3.9 mEq/L (ref 3.7–5.3)
Sodium: 132 mEq/L — ABNORMAL LOW (ref 137–147)

## 2014-08-17 LAB — HEMOGLOBIN A1C
Hgb A1c MFr Bld: 5.5 % (ref <5.7)
Mean Plasma Glucose: 111 mg/dL (ref <117)

## 2014-08-17 LAB — HEMOGLOBIN AND HEMATOCRIT, BLOOD
HCT: 25.6 % — ABNORMAL LOW (ref 36.0–46.0)
Hemoglobin: 8.7 g/dL — ABNORMAL LOW (ref 12.0–15.0)

## 2014-08-18 LAB — BASIC METABOLIC PANEL
Anion gap: 8 (ref 5–15)
BUN: 10 mg/dL (ref 6–23)
CALCIUM: 7.6 mg/dL — AB (ref 8.4–10.5)
CO2: 24 meq/L (ref 19–32)
Chloride: 106 mEq/L (ref 96–112)
Creatinine, Ser: 0.6 mg/dL (ref 0.50–1.10)
GFR calc Af Amer: >90 mL/min (ref >90)
Glucose, Bld: 101 mg/dL — ABNORMAL HIGH (ref 70–99)
Potassium: 3.6 mEq/L — ABNORMAL LOW (ref 3.7–5.3)
Sodium: 138 mEq/L (ref 137–147)

## 2014-08-18 LAB — CBC
HEMATOCRIT: 22.5 % — AB (ref 36.0–46.0)
HEMOGLOBIN: 7.6 g/dL — AB (ref 12.0–15.0)
MCH: 30 pg (ref 26.0–34.0)
MCHC: 33.8 g/dL (ref 30.0–36.0)
MCV: 88.9 fL (ref 78.0–100.0)
Platelets: 467 10*3/uL — ABNORMAL HIGH (ref 150–400)
RBC: 2.53 MIL/uL — AB (ref 3.87–5.11)
RDW: 15 % (ref 11.5–15.5)
WBC: 7.2 10*3/uL (ref 4.0–10.5)

## 2014-08-18 LAB — DIGOXIN LEVEL: DIGOXIN LVL: 0.9 ng/mL (ref 0.8–2.0)

## 2014-08-19 ENCOUNTER — Other Ambulatory Visit (HOSPITAL_COMMUNITY): Payer: Medicare Other

## 2014-08-20 LAB — CBC WITH DIFFERENTIAL/PLATELET
BASOS ABS: 0 10*3/uL (ref 0.0–0.1)
BASOS PCT: 1 % (ref 0–1)
Basophils Absolute: 0.1 10*3/uL (ref 0.0–0.1)
Basophils Relative: 1 % (ref 0–1)
EOS ABS: 0.1 10*3/uL (ref 0.0–0.7)
EOS PCT: 2 % (ref 0–5)
Eosinophils Absolute: 0.1 10*3/uL (ref 0.0–0.7)
Eosinophils Relative: 2 % (ref 0–5)
HCT: 20.3 % — ABNORMAL LOW (ref 36.0–46.0)
HCT: 20.8 % — ABNORMAL LOW (ref 36.0–46.0)
HEMOGLOBIN: 6.7 g/dL — AB (ref 12.0–15.0)
Hemoglobin: 7 g/dL — ABNORMAL LOW (ref 12.0–15.0)
LYMPHS ABS: 1.4 10*3/uL (ref 0.7–4.0)
LYMPHS ABS: 1.2 10*3/uL (ref 0.7–4.0)
LYMPHS PCT: 22 % (ref 12–46)
Lymphocytes Relative: 23 % (ref 12–46)
MCH: 29.6 pg (ref 26.0–34.0)
MCH: 30.8 pg (ref 26.0–34.0)
MCHC: 33 g/dL (ref 30.0–36.0)
MCHC: 33.7 g/dL (ref 30.0–36.0)
MCV: 89.8 fL (ref 78.0–100.0)
MCV: 91.6 fL (ref 78.0–100.0)
MONO ABS: 0.8 10*3/uL (ref 0.1–1.0)
MONOS PCT: 13 % — AB (ref 3–12)
Monocytes Absolute: 0.7 10*3/uL (ref 0.1–1.0)
Monocytes Relative: 12 % (ref 3–12)
NEUTROS PCT: 62 % (ref 43–77)
NEUTROS PCT: 63 % (ref 43–77)
Neutro Abs: 3.7 10*3/uL (ref 1.7–7.7)
Neutro Abs: 3.4 10*3/uL (ref 1.7–7.7)
PLATELETS: 430 10*3/uL — AB (ref 150–400)
Platelets: 419 10*3/uL — ABNORMAL HIGH (ref 150–400)
RBC: 2.26 MIL/uL — AB (ref 3.87–5.11)
RBC: 2.27 MIL/uL — ABNORMAL LOW (ref 3.87–5.11)
RDW: 15 % (ref 11.5–15.5)
RDW: 14.9 % (ref 11.5–15.5)
WBC: 6 10*3/uL (ref 4.0–10.5)
WBC: 5.5 10*3/uL (ref 4.0–10.5)

## 2014-08-20 LAB — MAGNESIUM: MAGNESIUM: 1.9 mg/dL (ref 1.5–2.5)

## 2014-08-20 LAB — BASIC METABOLIC PANEL
ANION GAP: 10 (ref 5–15)
BUN: 10 mg/dL (ref 6–23)
CHLORIDE: 104 meq/L (ref 96–112)
CO2: 25 mEq/L (ref 19–32)
Calcium: 7.9 mg/dL — ABNORMAL LOW (ref 8.4–10.5)
Creatinine, Ser: 0.61 mg/dL (ref 0.50–1.10)
GFR calc Af Amer: >90 mL/min (ref >90)
GFR calc non Af Amer: >90 mL/min (ref >90)
Glucose, Bld: 87 mg/dL (ref 70–99)
POTASSIUM: 3.6 meq/L — AB (ref 3.7–5.3)
SODIUM: 139 meq/L (ref 137–147)

## 2014-08-20 LAB — ABO/RH: ABO/RH(D): O POS

## 2014-08-20 LAB — PREPARE RBC (CROSSMATCH)

## 2014-08-20 LAB — PHOSPHORUS: Phosphorus: 3.4 mg/dL (ref 2.3–4.6)

## 2014-08-21 LAB — CBC WITH DIFFERENTIAL/PLATELET
BASOS PCT: 1 % (ref 0–1)
Basophils Absolute: 0 10*3/uL (ref 0.0–0.1)
EOS PCT: 2 % (ref 0–5)
Eosinophils Absolute: 0.1 10*3/uL (ref 0.0–0.7)
HEMATOCRIT: 24.8 % — AB (ref 36.0–46.0)
Hemoglobin: 8.3 g/dL — ABNORMAL LOW (ref 12.0–15.0)
Lymphocytes Relative: 25 % (ref 12–46)
Lymphs Abs: 1.8 10*3/uL (ref 0.7–4.0)
MCH: 28.9 pg (ref 26.0–34.0)
MCHC: 33.5 g/dL (ref 30.0–36.0)
MCV: 86.4 fL (ref 78.0–100.0)
MONOS PCT: 12 % (ref 3–12)
Monocytes Absolute: 0.9 10*3/uL (ref 0.1–1.0)
NEUTROS PCT: 62 % (ref 43–77)
Neutro Abs: 4.5 10*3/uL (ref 1.7–7.7)
Platelets: 453 10*3/uL — ABNORMAL HIGH (ref 150–400)
RBC: 2.87 MIL/uL — ABNORMAL LOW (ref 3.87–5.11)
RDW: 15.5 % (ref 11.5–15.5)
WBC: 7.4 10*3/uL (ref 4.0–10.5)

## 2014-08-21 LAB — BASIC METABOLIC PANEL
Anion gap: 9 (ref 5–15)
BUN: 8 mg/dL (ref 6–23)
CO2: 26 mEq/L (ref 19–32)
CREATININE: 0.59 mg/dL (ref 0.50–1.10)
Calcium: 8.2 mg/dL — ABNORMAL LOW (ref 8.4–10.5)
Chloride: 99 mEq/L (ref 96–112)
GFR calc Af Amer: >90 mL/min (ref >90)
Glucose, Bld: 62 mg/dL — ABNORMAL LOW (ref 70–99)
Potassium: 3.7 mEq/L (ref 3.7–5.3)
Sodium: 134 mEq/L — ABNORMAL LOW (ref 137–147)

## 2014-08-22 LAB — CBC
HCT: 29.9 % — ABNORMAL LOW (ref 36.0–46.0)
Hemoglobin: 10 g/dL — ABNORMAL LOW (ref 12.0–15.0)
MCH: 29.8 pg (ref 26.0–34.0)
MCHC: 33.4 g/dL (ref 30.0–36.0)
MCV: 89 fL (ref 78.0–100.0)
PLATELETS: 435 10*3/uL — AB (ref 150–400)
RBC: 3.36 MIL/uL — ABNORMAL LOW (ref 3.87–5.11)
RDW: 15.4 % (ref 11.5–15.5)
WBC: 7.4 10*3/uL (ref 4.0–10.5)

## 2014-08-22 LAB — TYPE AND SCREEN
ABO/RH(D): O POS
Antibody Screen: NEGATIVE
UNIT DIVISION: 0
Unit division: 0

## 2014-08-22 LAB — BASIC METABOLIC PANEL
Anion gap: 8 (ref 5–15)
BUN: 7 mg/dL (ref 6–23)
CALCIUM: 8.3 mg/dL — AB (ref 8.4–10.5)
CO2: 28 meq/L (ref 19–32)
CREATININE: 0.68 mg/dL (ref 0.50–1.10)
Chloride: 100 mEq/L (ref 96–112)
GFR calc Af Amer: >90 mL/min (ref >90)
Glucose, Bld: 76 mg/dL (ref 70–99)
Potassium: 3.6 mEq/L — ABNORMAL LOW (ref 3.7–5.3)
SODIUM: 136 meq/L — AB (ref 137–147)

## 2014-08-23 LAB — BASIC METABOLIC PANEL
ANION GAP: 12 (ref 5–15)
BUN: 7 mg/dL (ref 6–23)
CHLORIDE: 100 meq/L (ref 96–112)
CO2: 27 mEq/L (ref 19–32)
CREATININE: 0.67 mg/dL (ref 0.50–1.10)
Calcium: 8.3 mg/dL — ABNORMAL LOW (ref 8.4–10.5)
GFR calc non Af Amer: >90 mL/min (ref >90)
Glucose, Bld: 69 mg/dL — ABNORMAL LOW (ref 70–99)
POTASSIUM: 4.2 meq/L (ref 3.7–5.3)
Sodium: 139 mEq/L (ref 137–147)

## 2014-08-23 LAB — CBC
HEMATOCRIT: 30.9 % — AB (ref 36.0–46.0)
Hemoglobin: 10.3 g/dL — ABNORMAL LOW (ref 12.0–15.0)
MCH: 29.9 pg (ref 26.0–34.0)
MCHC: 33.3 g/dL (ref 30.0–36.0)
MCV: 89.8 fL (ref 78.0–100.0)
PLATELETS: 482 10*3/uL — AB (ref 150–400)
RBC: 3.44 MIL/uL — ABNORMAL LOW (ref 3.87–5.11)
RDW: 15.2 % (ref 11.5–15.5)
WBC: 7.5 10*3/uL (ref 4.0–10.5)

## 2014-08-25 ENCOUNTER — Other Ambulatory Visit (HOSPITAL_COMMUNITY): Payer: Medicare Other

## 2014-08-25 ENCOUNTER — Other Ambulatory Visit (HOSPITAL_COMMUNITY): Payer: Medicare Other | Attending: Internal Medicine

## 2014-08-25 DIAGNOSIS — I359 Nonrheumatic aortic valve disorder, unspecified: Secondary | ICD-10-CM

## 2014-08-25 LAB — BASIC METABOLIC PANEL
ANION GAP: 17 — AB (ref 5–15)
BUN: 7 mg/dL (ref 6–23)
CO2: 18 mEq/L — ABNORMAL LOW (ref 19–32)
Calcium: 8.4 mg/dL (ref 8.4–10.5)
Chloride: 95 mEq/L — ABNORMAL LOW (ref 96–112)
Creatinine, Ser: 0.71 mg/dL (ref 0.50–1.10)
Glucose, Bld: 140 mg/dL — ABNORMAL HIGH (ref 70–99)
POTASSIUM: 4.2 meq/L (ref 3.7–5.3)
SODIUM: 130 meq/L — AB (ref 137–147)

## 2014-08-25 LAB — CBC
HCT: 31.6 % — ABNORMAL LOW (ref 36.0–46.0)
HCT: 43.1 % (ref 36.0–46.0)
Hemoglobin: 10.2 g/dL — ABNORMAL LOW (ref 12.0–15.0)
Hemoglobin: 14.4 g/dL (ref 12.0–15.0)
MCH: 29 pg (ref 26.0–34.0)
MCH: 29.8 pg (ref 26.0–34.0)
MCHC: 32.3 g/dL (ref 30.0–36.0)
MCHC: 33.4 g/dL (ref 30.0–36.0)
MCV: 89.8 fL (ref 78.0–100.0)
MCV: 89 fL (ref 78.0–100.0)
PLATELETS: 619 10*3/uL — AB (ref 150–400)
Platelets: 410 10*3/uL — ABNORMAL HIGH (ref 150–400)
RBC: 3.52 MIL/uL — ABNORMAL LOW (ref 3.87–5.11)
RBC: 4.84 MIL/uL (ref 3.87–5.11)
RDW: 14.8 % (ref 11.5–15.5)
RDW: 14.9 % (ref 11.5–15.5)
WBC: 13.5 10*3/uL — ABNORMAL HIGH (ref 4.0–10.5)
WBC: 23.5 10*3/uL — ABNORMAL HIGH (ref 4.0–10.5)

## 2014-08-25 LAB — BLOOD GAS, ARTERIAL
Acid-base deficit: 13.3 mmol/L — ABNORMAL HIGH (ref 0.0–2.0)
BICARBONATE: 12.2 meq/L — AB (ref 20.0–24.0)
FIO2: 1 %
O2 Saturation: 99.1 %
PCO2 ART: 27.1 mmHg — AB (ref 35.0–45.0)
PEEP: 5 cmH2O
PO2 ART: 299 mmHg — AB (ref 80.0–100.0)
Patient temperature: 98.6
RATE: 14 resp/min
TCO2: 13 mmol/L (ref 0–100)
VT: 500 mL
pH, Arterial: 7.275 — ABNORMAL LOW (ref 7.350–7.450)

## 2014-08-25 LAB — COMPREHENSIVE METABOLIC PANEL
ALT: 62 U/L — ABNORMAL HIGH (ref 0–35)
AST: 113 U/L — AB (ref 0–37)
Albumin: 1.9 g/dL — ABNORMAL LOW (ref 3.5–5.2)
Alkaline Phosphatase: 152 U/L — ABNORMAL HIGH (ref 39–117)
Anion gap: 19 — ABNORMAL HIGH (ref 5–15)
BUN: 9 mg/dL (ref 6–23)
CALCIUM: 8.8 mg/dL (ref 8.4–10.5)
CO2: 19 meq/L (ref 19–32)
CREATININE: 0.71 mg/dL (ref 0.50–1.10)
Chloride: 98 mEq/L (ref 96–112)
GFR calc non Af Amer: >90 mL/min (ref >90)
GLUCOSE: 114 mg/dL — AB (ref 70–99)
Potassium: 4.7 mEq/L (ref 3.7–5.3)
Sodium: 136 mEq/L — ABNORMAL LOW (ref 137–147)
TOTAL PROTEIN: 6.6 g/dL (ref 6.0–8.3)
Total Bilirubin: 0.3 mg/dL (ref 0.3–1.2)

## 2014-08-25 LAB — TROPONIN I
Troponin I: <0.3 ng/mL (ref <0.30)
Troponin I: 0.4 ng/mL (ref <0.30)

## 2014-08-25 LAB — POCT I-STAT, CHEM 8
BUN: 6 mg/dL (ref 6–23)
CALCIUM ION: 1.17 mmol/L (ref 1.12–1.23)
CHLORIDE: 101 meq/L (ref 96–112)
Creatinine, Ser: 0.7 mg/dL (ref 0.50–1.10)
Glucose, Bld: 140 mg/dL — ABNORMAL HIGH (ref 70–99)
HEMATOCRIT: 31 % — AB (ref 36.0–46.0)
Hemoglobin: 10.5 g/dL — ABNORMAL LOW (ref 12.0–15.0)
Potassium: 3.8 mEq/L (ref 3.7–5.3)
SODIUM: 131 meq/L — AB (ref 137–147)
TCO2: 24 mmol/L (ref 0–100)

## 2014-08-25 LAB — LACTIC ACID, PLASMA: Lactic Acid, Venous: 6.1 mmol/L — ABNORMAL HIGH (ref 0.5–2.2)

## 2014-08-25 MED FILL — Medication: Qty: 1 | Status: AC

## 2014-08-25 NOTE — Progress Notes (Signed)
CODE BLUE NOTE  Patient Name: Alyssa Gray   MRN: [id number]   Date of Birth/ Sex: 1961-05-26 , female      Admission Date: 08/11/2014  Attending Provider: Quintella Reichert, MD  Primary Diagnosis: <principal problem not specified>    Indication: Pt was in her usual state of health until this AM, when she was noted to be unresponsive. Code blue was subsequently called. At the time of arrival on scene, ACLS protocol was underway.    Technical Description:  - CPR performance duration:  35 minute  - Was defibrillation or cardioversion used? Yes   - Was external pacer placed? No  - Was patient intubated pre/post CPR? Yes    Medications Administered: Y = Yes; Blank = No Amiodarone  Y  Atropine  Y  Calcium    Epinephrine  Y  Lidocaine    Magnesium  Y  Norepinephrine  Y  Phenylephrine    Sodium bicarbonate    Vasopressin      Post CPR evaluation:  - Final Status - Was patient successfully resuscitated ? Yes - What is current rhythm? A fib with slow Ventricular response - What is current hemodynamic status? Low BP on levophed drip and NS bolus.    Miscellaneous Information:  - Labs sent, including: CBC, BMET, Lactic acid, trop and blood gas.  - Primary team notified?  Yes, Dr Laren Everts notified at 4:30am  - Family Notified? No, deferred to primary team.  - Additional notes/ transfer status:         Signed:  Jessee Avers, MD PGY-3 Internal Medicine Teaching Service Pager: [phone] 08/25/2014, 4:47 AM

## 2014-08-25 NOTE — Progress Notes (Signed)
Echocardiogram 2D Echocardiogram has been performed.  Joelene Millin 08/25/2014, 4:38 PM

## 2014-08-26 ENCOUNTER — Other Ambulatory Visit (HOSPITAL_COMMUNITY): Payer: Medicare Other

## 2014-08-26 ENCOUNTER — Inpatient Hospital Stay (HOSPITAL_COMMUNITY): Payer: Medicare Other

## 2014-08-26 ENCOUNTER — Inpatient Hospital Stay (HOSPITAL_COMMUNITY)
Admission: AD | Admit: 2014-08-26 | Discharge: 2014-09-17 | DRG: 308 | Disposition: E | Payer: Medicare Other | Source: Ambulatory Visit | Attending: Pulmonary Disease | Admitting: Pulmonary Disease

## 2014-08-26 ENCOUNTER — Encounter (HOSPITAL_COMMUNITY): Payer: Self-pay | Admitting: Neurology

## 2014-08-26 DIAGNOSIS — Z515 Encounter for palliative care: Secondary | ICD-10-CM

## 2014-08-26 DIAGNOSIS — D473 Essential (hemorrhagic) thrombocythemia: Secondary | ICD-10-CM | POA: Diagnosis present

## 2014-08-26 DIAGNOSIS — E871 Hypo-osmolality and hyponatremia: Secondary | ICD-10-CM | POA: Diagnosis present

## 2014-08-26 DIAGNOSIS — Z9289 Personal history of other medical treatment: Secondary | ICD-10-CM

## 2014-08-26 DIAGNOSIS — R579 Shock, unspecified: Secondary | ICD-10-CM | POA: Diagnosis present

## 2014-08-26 DIAGNOSIS — J9600 Acute respiratory failure, unspecified whether with hypoxia or hypercapnia: Secondary | ICD-10-CM | POA: Diagnosis present

## 2014-08-26 DIAGNOSIS — E43 Unspecified severe protein-calorie malnutrition: Secondary | ICD-10-CM | POA: Diagnosis present

## 2014-08-26 DIAGNOSIS — N179 Acute kidney failure, unspecified: Secondary | ICD-10-CM | POA: Diagnosis not present

## 2014-08-26 DIAGNOSIS — F17210 Nicotine dependence, cigarettes, uncomplicated: Secondary | ICD-10-CM | POA: Diagnosis present

## 2014-08-26 DIAGNOSIS — Z8673 Personal history of transient ischemic attack (TIA), and cerebral infarction without residual deficits: Secondary | ICD-10-CM

## 2014-08-26 DIAGNOSIS — G40501 Epileptic seizures related to external causes, not intractable, with status epilepticus: Secondary | ICD-10-CM | POA: Diagnosis present

## 2014-08-26 DIAGNOSIS — Z66 Do not resuscitate: Secondary | ICD-10-CM | POA: Diagnosis not present

## 2014-08-26 DIAGNOSIS — D649 Anemia, unspecified: Secondary | ICD-10-CM | POA: Diagnosis present

## 2014-08-26 DIAGNOSIS — I252 Old myocardial infarction: Secondary | ICD-10-CM | POA: Diagnosis not present

## 2014-08-26 DIAGNOSIS — D72829 Elevated white blood cell count, unspecified: Secondary | ICD-10-CM | POA: Diagnosis present

## 2014-08-26 DIAGNOSIS — E876 Hypokalemia: Secondary | ICD-10-CM | POA: Diagnosis present

## 2014-08-26 DIAGNOSIS — F1410 Cocaine abuse, uncomplicated: Secondary | ICD-10-CM | POA: Diagnosis present

## 2014-08-26 DIAGNOSIS — Z0189 Encounter for other specified special examinations: Secondary | ICD-10-CM

## 2014-08-26 DIAGNOSIS — G931 Anoxic brain damage, not elsewhere classified: Secondary | ICD-10-CM | POA: Diagnosis present

## 2014-08-26 DIAGNOSIS — I469 Cardiac arrest, cause unspecified: Secondary | ICD-10-CM | POA: Diagnosis present

## 2014-08-26 DIAGNOSIS — Y95 Nosocomial condition: Secondary | ICD-10-CM | POA: Diagnosis present

## 2014-08-26 DIAGNOSIS — J9811 Atelectasis: Secondary | ICD-10-CM | POA: Diagnosis present

## 2014-08-26 DIAGNOSIS — Z955 Presence of coronary angioplasty implant and graft: Secondary | ICD-10-CM

## 2014-08-26 DIAGNOSIS — G40901 Epilepsy, unspecified, not intractable, with status epilepticus: Secondary | ICD-10-CM | POA: Diagnosis present

## 2014-08-26 DIAGNOSIS — G40301 Generalized idiopathic epilepsy and epileptic syndromes, not intractable, with status epilepticus: Secondary | ICD-10-CM

## 2014-08-26 DIAGNOSIS — I10 Essential (primary) hypertension: Secondary | ICD-10-CM | POA: Diagnosis present

## 2014-08-26 DIAGNOSIS — R739 Hyperglycemia, unspecified: Secondary | ICD-10-CM | POA: Diagnosis present

## 2014-08-26 DIAGNOSIS — I5041 Acute combined systolic (congestive) and diastolic (congestive) heart failure: Secondary | ICD-10-CM | POA: Diagnosis present

## 2014-08-26 DIAGNOSIS — Z7901 Long term (current) use of anticoagulants: Secondary | ICD-10-CM

## 2014-08-26 DIAGNOSIS — Z452 Encounter for adjustment and management of vascular access device: Secondary | ICD-10-CM

## 2014-08-26 DIAGNOSIS — J189 Pneumonia, unspecified organism: Secondary | ICD-10-CM | POA: Diagnosis present

## 2014-08-26 DIAGNOSIS — Z4659 Encounter for fitting and adjustment of other gastrointestinal appliance and device: Secondary | ICD-10-CM

## 2014-08-26 DIAGNOSIS — I739 Peripheral vascular disease, unspecified: Secondary | ICD-10-CM | POA: Diagnosis present

## 2014-08-26 DIAGNOSIS — I4901 Ventricular fibrillation: Principal | ICD-10-CM | POA: Diagnosis present

## 2014-08-26 DIAGNOSIS — I2510 Atherosclerotic heart disease of native coronary artery without angina pectoris: Secondary | ICD-10-CM | POA: Diagnosis present

## 2014-08-26 DIAGNOSIS — R569 Unspecified convulsions: Secondary | ICD-10-CM

## 2014-08-26 DIAGNOSIS — G9340 Encephalopathy, unspecified: Secondary | ICD-10-CM

## 2014-08-26 DIAGNOSIS — J9690 Respiratory failure, unspecified, unspecified whether with hypoxia or hypercapnia: Secondary | ICD-10-CM

## 2014-08-26 HISTORY — DX: Acute myocardial infarction, unspecified: I21.9

## 2014-08-26 LAB — PROTIME-INR
INR: 1.25 (ref 0.00–1.49)
Prothrombin Time: 15.9 seconds — ABNORMAL HIGH (ref 11.6–15.2)

## 2014-08-26 LAB — BLOOD GAS, ARTERIAL
ACID-BASE DEFICIT: 4.5 mmol/L — AB (ref 0.0–2.0)
Acid-base deficit: 1.8 mmol/L (ref 0.0–2.0)
BICARBONATE: 19.5 meq/L — AB (ref 20.0–24.0)
Bicarbonate: 21.7 mEq/L (ref 20.0–24.0)
Drawn by: 313941
FIO2: 0.4 %
FIO2: 0.4 %
MECHVT: 450 mL
O2 SAT: 97.6 %
O2 Saturation: 95.6 %
PATIENT TEMPERATURE: 98.6
PCO2 ART: 32.2 mmHg — AB (ref 35.0–45.0)
PCO2 ART: 33.3 mmHg — AB (ref 35.0–45.0)
PEEP: 5 cmH2O
PEEP: 5 cmH2O
PO2 ART: 84.2 mmHg (ref 80.0–100.0)
Patient temperature: 99.7
RATE: 12 resp/min
RATE: 12 resp/min
TCO2: 22.7 mmol/L (ref 0–100)
TCO2: 20.5 mmol/L (ref 0–100)
VT: 450 mL
pH, Arterial: 7.443 (ref 7.350–7.450)
pH, Arterial: 7.388 (ref 7.350–7.450)
pO2, Arterial: 92.9 mmHg (ref 80.0–100.0)

## 2014-08-26 LAB — COMPREHENSIVE METABOLIC PANEL
ALK PHOS: 88 U/L (ref 39–117)
ALT: 31 U/L (ref 0–35)
ANION GAP: 13 (ref 5–15)
AST: 30 U/L (ref 0–37)
Albumin: 1.4 g/dL — ABNORMAL LOW (ref 3.5–5.2)
BUN: 20 mg/dL (ref 6–23)
CO2: 20 mEq/L (ref 19–32)
CREATININE: 1.18 mg/dL — AB (ref 0.50–1.10)
Calcium: 7.5 mg/dL — ABNORMAL LOW (ref 8.4–10.5)
Chloride: 100 mEq/L (ref 96–112)
GFR calc Af Amer: 59 mL/min — ABNORMAL LOW (ref >90)
GFR calc non Af Amer: 51 mL/min — ABNORMAL LOW (ref >90)
Glucose, Bld: 73 mg/dL (ref 70–99)
POTASSIUM: 3.6 meq/L — AB (ref 3.7–5.3)
Sodium: 133 mEq/L — ABNORMAL LOW (ref 137–147)
TOTAL PROTEIN: 5.3 g/dL — AB (ref 6.0–8.3)
Total Bilirubin: 0.3 mg/dL (ref 0.3–1.2)

## 2014-08-26 LAB — URINALYSIS, ROUTINE W REFLEX MICROSCOPIC
Bilirubin Urine: NEGATIVE
Glucose, UA: NEGATIVE mg/dL
Hgb urine dipstick: NEGATIVE
Ketones, ur: NEGATIVE mg/dL
Leukocytes, UA: NEGATIVE
NITRITE: NEGATIVE
Protein, ur: NEGATIVE mg/dL
Specific Gravity, Urine: 1.014 (ref 1.005–1.030)
UROBILINOGEN UA: 0.2 mg/dL (ref 0.0–1.0)
pH: 5 (ref 5.0–8.0)

## 2014-08-26 LAB — GLUCOSE, CAPILLARY
GLUCOSE-CAPILLARY: 68 mg/dL — AB (ref 70–99)
GLUCOSE-CAPILLARY: 65 mg/dL — AB (ref 70–99)
GLUCOSE-CAPILLARY: 103 mg/dL — AB (ref 70–99)
Glucose-Capillary: 73 mg/dL (ref 70–99)
Glucose-Capillary: 85 mg/dL (ref 70–99)

## 2014-08-26 LAB — CBC WITH DIFFERENTIAL/PLATELET
Basophils Absolute: 0 10*3/uL (ref 0.0–0.1)
Basophils Relative: 0 % (ref 0–1)
Eosinophils Absolute: 0 10*3/uL (ref 0.0–0.7)
Eosinophils Relative: 0 % (ref 0–5)
HCT: 33.4 % — ABNORMAL LOW (ref 36.0–46.0)
HEMOGLOBIN: 11.1 g/dL — AB (ref 12.0–15.0)
Lymphocytes Relative: 7 % — ABNORMAL LOW (ref 12–46)
Lymphs Abs: 0.9 10*3/uL (ref 0.7–4.0)
MCH: 29.1 pg (ref 26.0–34.0)
MCHC: 33.2 g/dL (ref 30.0–36.0)
MCV: 87.7 fL (ref 78.0–100.0)
MONOS PCT: 7 % (ref 3–12)
Monocytes Absolute: 1 10*3/uL (ref 0.1–1.0)
Neutro Abs: 12.2 10*3/uL — ABNORMAL HIGH (ref 1.7–7.7)
Neutrophils Relative %: 86 % — ABNORMAL HIGH (ref 43–77)
PLATELETS: 397 10*3/uL (ref 150–400)
RBC: 3.81 MIL/uL — ABNORMAL LOW (ref 3.87–5.11)
RDW: 15.2 % (ref 11.5–15.5)
WBC: 14.1 10*3/uL — ABNORMAL HIGH (ref 4.0–10.5)

## 2014-08-26 LAB — CBC
HCT: 37.1 % (ref 36.0–46.0)
Hemoglobin: 12.6 g/dL (ref 12.0–15.0)
MCH: 29.9 pg (ref 26.0–34.0)
MCHC: 34 g/dL (ref 30.0–36.0)
MCV: 87.9 fL (ref 78.0–100.0)
Platelets: 452 10*3/uL — ABNORMAL HIGH (ref 150–400)
RBC: 4.22 MIL/uL (ref 3.87–5.11)
RDW: 15.2 % (ref 11.5–15.5)
WBC: 14.7 10*3/uL — AB (ref 4.0–10.5)

## 2014-08-26 LAB — BASIC METABOLIC PANEL
ANION GAP: 13 (ref 5–15)
BUN: 22 mg/dL (ref 6–23)
CHLORIDE: 100 meq/L (ref 96–112)
CO2: 20 meq/L (ref 19–32)
Calcium: 7.7 mg/dL — ABNORMAL LOW (ref 8.4–10.5)
Creatinine, Ser: 1.23 mg/dL — ABNORMAL HIGH (ref 0.50–1.10)
GFR calc Af Amer: 57 mL/min — ABNORMAL LOW (ref >90)
GFR calc non Af Amer: 49 mL/min — ABNORMAL LOW (ref >90)
Glucose, Bld: 98 mg/dL (ref 70–99)
POTASSIUM: 4 meq/L (ref 3.7–5.3)
SODIUM: 133 meq/L — AB (ref 137–147)

## 2014-08-26 LAB — APTT: APTT: 33 s (ref 24–37)

## 2014-08-26 LAB — PHENYTOIN LEVEL, TOTAL: PHENYTOIN LVL: 6.2 ug/mL — AB (ref 10.0–20.0)

## 2014-08-26 LAB — LACTIC ACID, PLASMA: LACTIC ACID, VENOUS: 0.8 mmol/L (ref 0.5–2.2)

## 2014-08-26 LAB — DIGOXIN LEVEL: Digoxin Level: 1.3 ng/mL (ref 0.8–2.0)

## 2014-08-26 LAB — MRSA PCR SCREENING: MRSA by PCR: NEGATIVE

## 2014-08-26 LAB — PRO B NATRIURETIC PEPTIDE: PRO B NATRI PEPTIDE: 6719 pg/mL — AB (ref 0–125)

## 2014-08-26 MED ORDER — LORAZEPAM 2 MG/ML IJ SOLN
2.0000 mg | Freq: Once | INTRAMUSCULAR | Status: DC
  Filled 2014-08-26: qty 1

## 2014-08-26 MED ORDER — INSULIN ASPART 100 UNIT/ML ~~LOC~~ SOLN
2.0000 [IU] | SUBCUTANEOUS | Status: DC
  Administered 2014-08-27 (×2): 2 [IU] via SUBCUTANEOUS

## 2014-08-26 MED ORDER — VITAL HIGH PROTEIN PO LIQD: 1000.0000 mL | ORAL | Status: DC

## 2014-08-26 MED ORDER — DEXTROSE 5 % IV SOLN
2.0000 ug/min | INTRAVENOUS | Status: DC
  Administered 2014-08-26: 5 ug/min via INTRAVENOUS
  Administered 2014-08-27: 8 ug/min via INTRAVENOUS
  Administered 2014-08-27: 5 ug/min via INTRAVENOUS
  Filled 2014-08-26 (×3): qty 4

## 2014-08-26 MED ORDER — PROPOFOL 10 MG/ML IV EMUL
5.0000 ug/kg/min | INTRAVENOUS | Status: DC
  Administered 2014-08-26: 20 ug/kg/min via INTRAVENOUS
  Administered 2014-08-26: 50 ug/kg/min via INTRAVENOUS
  Administered 2014-08-27: 30 ug/kg/min via INTRAVENOUS
  Administered 2014-08-28: 15 ug/kg/min via INTRAVENOUS
  Filled 2014-08-26 (×4): qty 100

## 2014-08-26 MED ORDER — PHENYTOIN SODIUM 50 MG/ML IJ SOLN
100.0000 mg | Freq: Three times a day (TID) | INTRAMUSCULAR | Status: DC
  Administered 2014-08-26 – 2014-08-28 (×6): 100 mg via INTRAVENOUS
  Filled 2014-08-26 (×8): qty 2

## 2014-08-26 MED ORDER — PROPOFOL BOLUS VIA INFUSION
2.0000 mg/kg | Freq: Once | INTRAVENOUS | Status: AC
  Administered 2014-08-26: 97 mg via INTRAVENOUS

## 2014-08-26 MED ORDER — SODIUM CHLORIDE 0.9 % IV BOLUS (SEPSIS)
1000.0000 mL | Freq: Once | INTRAVENOUS | Status: AC
  Administered 2014-08-26: 1000 mL via INTRAVENOUS

## 2014-08-26 MED ORDER — SODIUM CHLORIDE 0.9 % IV SOLN
2000.0000 mg | Freq: Two times a day (BID) | INTRAVENOUS | Status: DC
  Administered 2014-08-26 – 2014-08-28 (×4): 2000 mg via INTRAVENOUS
  Filled 2014-08-26 (×6): qty 20

## 2014-08-26 MED ORDER — SODIUM CHLORIDE 0.9 % IV SOLN
INTRAVENOUS | Status: DC
  Administered 2014-08-26: 15:00:00 via INTRAVENOUS

## 2014-08-26 MED ORDER — HEPARIN SODIUM (PORCINE) 5000 UNIT/ML IJ SOLN
5000.0000 [IU] | Freq: Three times a day (TID) | INTRAMUSCULAR | Status: DC
  Administered 2014-08-26 – 2014-08-28 (×6): 5000 [IU] via SUBCUTANEOUS
  Filled 2014-08-26 (×8): qty 1

## 2014-08-26 MED ORDER — CHLORHEXIDINE GLUCONATE 0.12 % MT SOLN
15.0000 mL | Freq: Two times a day (BID) | OROMUCOSAL | Status: DC
  Administered 2014-08-26 – 2014-08-28 (×4): 15 mL via OROMUCOSAL
  Filled 2014-08-26 (×4): qty 15

## 2014-08-26 MED ORDER — PROPOFOL BOLUS VIA INFUSION
1.0000 mg/kg | Freq: Once | INTRAVENOUS | Status: AC
  Administered 2014-08-26: 48.5 mg via INTRAVENOUS
  Filled 2014-08-26: qty 49

## 2014-08-26 MED ORDER — LEVETIRACETAM IN NACL 1000 MG/100ML IV SOLN
1000.0000 mg | Freq: Two times a day (BID) | INTRAVENOUS | Status: DC
  Filled 2014-08-26 (×2): qty 100

## 2014-08-26 MED ORDER — CETYLPYRIDINIUM CHLORIDE 0.05 % MT LIQD
7.0000 mL | Freq: Four times a day (QID) | OROMUCOSAL | Status: DC
  Administered 2014-08-26 – 2014-08-28 (×8): 7 mL via OROMUCOSAL

## 2014-08-26 MED ORDER — DEXTROSE 50 % IV SOLN
50.0000 mL | Freq: Once | INTRAVENOUS | Status: AC
  Administered 2014-08-26: 50 mL via INTRAVENOUS
  Filled 2014-08-26: qty 50

## 2014-08-26 MED ORDER — LORAZEPAM 2 MG/ML IJ SOLN: 2.0000 mg | Freq: Once | INTRAMUSCULAR | Status: AC

## 2014-08-26 MED ORDER — VANCOMYCIN HCL IN DEXTROSE 1-5 GM/200ML-% IV SOLN
1000.0000 mg | INTRAVENOUS | Status: DC
  Administered 2014-08-26 – 2014-08-27 (×2): 1000 mg via INTRAVENOUS
  Filled 2014-08-26 (×3): qty 200

## 2014-08-26 MED ORDER — PANTOPRAZOLE SODIUM 40 MG IV SOLR
40.0000 mg | INTRAVENOUS | Status: DC
  Administered 2014-08-26: 40 mg via INTRAVENOUS
  Filled 2014-08-26 (×2): qty 40

## 2014-08-26 MED ORDER — FENTANYL CITRATE 0.05 MG/ML IJ SOLN: 100.0000 ug | INTRAMUSCULAR | Status: DC | PRN

## 2014-08-26 MED ORDER — PIPERACILLIN-TAZOBACTAM 3.375 G IVPB
3.3750 g | Freq: Three times a day (TID) | INTRAVENOUS | Status: DC
  Filled 2014-08-26 (×3): qty 50

## 2014-08-26 MED ORDER — SODIUM CHLORIDE 0.9 % IV SOLN: INTRAVENOUS | Status: DC

## 2014-08-26 MED ORDER — PIPERACILLIN-TAZOBACTAM 3.375 G IVPB
3.3750 g | Freq: Three times a day (TID) | INTRAVENOUS | Status: DC
  Administered 2014-08-26 – 2014-08-28 (×5): 3.375 g via INTRAVENOUS
  Filled 2014-08-26 (×7): qty 50

## 2014-08-26 MED ORDER — VITAL AF 1.2 CAL PO LIQD
1000.0000 mL | ORAL | Status: DC
  Administered 2014-08-27 (×2): 1000 mL
  Filled 2014-08-26 (×4): qty 1000

## 2014-08-26 MED ORDER — PROPOFOL BOLUS VIA INFUSION
0.5000 mg/kg | Freq: Once | INTRAVENOUS | Status: AC
  Administered 2014-08-26: 24.25 mg via INTRAVENOUS
  Filled 2014-08-26: qty 25

## 2014-08-26 MED ORDER — LORAZEPAM 2 MG/ML IJ SOLN
INTRAMUSCULAR | Status: AC
  Administered 2014-08-26: 23:00:00
  Filled 2014-08-26: qty 1

## 2014-08-26 MED ORDER — AMIODARONE HCL 200 MG PO TABS
200.0000 mg | ORAL_TABLET | Freq: Two times a day (BID) | ORAL | Status: DC
  Administered 2014-08-26 – 2014-08-27 (×2): 200 mg via ORAL
  Filled 2014-08-26 (×4): qty 1

## 2014-08-26 MED ORDER — VANCOMYCIN HCL IN DEXTROSE 750-5 MG/150ML-% IV SOLN
750.0000 mg | INTRAVENOUS | Status: DC
  Filled 2014-08-26: qty 150

## 2014-08-26 MED ORDER — DEXTROSE-NACL 5-0.9 % IV SOLN
INTRAVENOUS | Status: DC
  Administered 2014-08-26: 20:00:00 via INTRAVENOUS

## 2014-08-26 MED ORDER — SODIUM CHLORIDE 0.9 % IV SOLN
200.0000 mg | Freq: Two times a day (BID) | INTRAVENOUS | Status: DC
  Administered 2014-08-26 – 2014-08-28 (×4): 200 mg via INTRAVENOUS
  Filled 2014-08-26 (×7): qty 20

## 2014-08-26 NOTE — Progress Notes (Signed)
ANTIBIOTIC and MISCELLANEOUS CONSULT NOTE - INITIAL  Pharmacy Consult for Vancomycin and Zosyn; Phenytoin Indication: pneumonia and Seizure activity s/p cardiac arrest  No Known Allergies  Patient Measurements: Weight: 106 lb 14.8 oz (48.5 kg) Adjusted Body Weight:   Vital Signs: Temp: 99.7 F (37.6 C) (12/10 1430) Temp Source: Oral (12/10 1430) BP: 128/58 mmHg (12/10 1445) Pulse Rate: 66 (12/10 1445) Intake/Output from previous day:   Intake/Output from this shift:    Labs:  Recent Labs  08/25/14 0359 08/25/14 0630 08/20/2014 1045  WBC 13.5* 23.5* 14.7*  HGB 10.2* 14.4 12.6  PLT 410* 619* 452*  CREATININE 0.71 0.71 1.23*   Estimated Creatinine Clearance: 40 mL/min (by C-G formula based on Cr of 1.23). No results for input(s): VANCOTROUGH, VANCOPEAK, VANCORANDOM, GENTTROUGH, GENTPEAK, GENTRANDOM, TOBRATROUGH, TOBRAPEAK, TOBRARND, AMIKACINPEAK, AMIKACINTROU, AMIKACIN in the last 72 hours.   Microbiology: Recent Results (from the past 720 hour(s))  Stool culture     Status: None   Collection Time: 08/11/14  3:35 AM  Result Value Ref Range Status   Specimen Description STOOL  Final   Special Requests NONE  Final   Culture   Final    NO SALMONELLA, SHIGELLA, CAMPYLOBACTER, YERSINIA, OR E.COLI 0157:H7 ISOLATED Performed at [facility]    Report Status 08/15/2014 FINAL  Final  Clostridium Difficile by PCR     Status: None   Collection Time: 08/14/14  4:07 PM  Result Value Ref Range Status   C difficile by pcr NEGATIVE NEGATIVE Final    Medical History: Past Medical History  Diagnosis Date  . Coronary artery disease   . CHF (congestive heart failure)   . Hypertension   . Cocaine abuse   . Stroke     TIAs  . Mesenteric ischemia   . Peripheral arterial disease   . Acute MI     Medications:  Scheduled:  . amiodarone  200 mg Oral BID  . antiseptic oral rinse  7 mL Mouth Rinse QID  . chlorhexidine  15 mL Mouth Rinse BID  . insulin aspart  2-6 Units  Subcutaneous 6 times per day  . levETIRAcetam  1,000 mg Intravenous Q12H  . phenytoin (DILANTIN) IV  100 mg Intravenous 3 times per day   Assessment: 53yo female completing 1st 24hr of Vancomycin and Zosyn for presumed aspiration pneumonia s/p cardiac arrest on 12/9 as well as Keppra/Phenytoin for seizure activity s/p arrest.  Cr has increased from 0.71 to 1.23 in this time.  She was receiving Vancomycin 1000mg  IV q24 and Zosyn 3.375g IV q6 at [facility], and Phenytoin 100mg  IV q8.  Phenytoin level this AM 6.2, correcting to 12.9.  No further sz activity has been noted.  Goal of Therapy:  Vancomycin trough level 15-20 mcg/ml  Phenytoin level 10-20  Plan:  Continue Vancomycin 1000mg  IV q24, next dose at 2200 Change Zosyn to 3.375g IV q8, infusing over 4hr Continue Phenytoin 100mg  IV q8 Plan SS Vancomycin trough and Phenytoin trough Watch renal fxn and f/u blood/urine cx results.  Gracy Bruins, PharmD Clinical Pharmacist [facility]

## 2014-08-26 NOTE — Consult Note (Signed)
Name: Alyssa Gray MRN: [id number] DOB: 1961/07/02    ADMISSION DATE:  08/11/2014 CONSULTATION DATE:  12/10  REFERRING MD :  Hijazi   CHIEF COMPLAINT:  Vent management, post arrest   BRIEF PATIENT DESCRIPTION: 53yo female with hx CAD s/p stent ( EF 25-30%), HTN, hx cocaine abuse PAD with mesenteric ischemia initially admitted to [facility] with abd pain/distention.  Ultimately underwent exploratory lap 11/17 for colonic mass resection.  Course c/b CHF, R pleural effusion (s/p thoracentesis with 442ml off), and malnutrition on TPN.  Was tx 11/24 to [facility] for rehab.  On 12/9 was found unresponsive, initial rhythm VFib.  Had CPR for total 35 minutes before ROSC.  Post arrest had witnessed seizures, started on keppra, dilantin and propofol gtt.  PCCM consulted 12/10 for vent management.    SIGNIFICANT EVENTS  12/9 VFib arrest   STUDIES:  2D echo 12/9>>> EF 25-30%, diffuse hypokinesis, mild TR, mild MR, grade 1 diastolic dysfunction    HISTORY OF PRESENT ILLNESS:  53yo female with hx CAD s/p stent ( EF 25-30%), HTN, hx cocaine abuse PAD with mesenteric ischemia initially admitted to [facility] with abd pain/distention.  Ultimately underwent exploratory lap 11/17 for colonic mass resection.  Course c/b CHF, R pleural effusion (s/p thoracentesis with 431ml off), and malnutrition on TPN.  Was tx 11/24 to [facility] for rehab.  On 12/9 was found unresponsive, initial rhythm VFib.  Had CPR for total 35 minutes before ROSC.  Post arrest had witnessed seizures, started on keppra, dilantin and propofol gtt.  PCCM consulted 12/10 for vent management.   PAST MEDICAL HISTORY :   has a past medical history of Coronary artery disease; CHF (congestive heart failure); Hypertension; Cocaine abuse; Stroke; Mesenteric ischemia; and Peripheral arterial disease.  has no past surgical history on file. Prior to Admission medications   Medication Sig Start Date End Date Taking?  Authorizing Provider  clopidogrel (PLAVIX) 75 MG tablet Take 75 mg by mouth daily. 06/19/14   Historical Provider, MD  furosemide (LASIX) 40 MG tablet Take 40 mg by mouth daily. 06/19/14   Historical Provider, MD  isosorbide mononitrate (IMDUR) 120 MG 24 hr tablet Take 120 mg by mouth daily. 06/19/14   Historical Provider, MD  NITROSTAT 0.4 MG SL tablet Take 0.4 mg by mouth every 5 (five) minutes x 3 doses as needed. 07/16/14   Historical Provider, MD   No Known Allergies  FAMILY HISTORY:  family history is not on file. SOCIAL HISTORY:  reports that she has been smoking.  She does not have any smokeless tobacco history on file. She reports that she drinks alcohol. She reports that she uses illicit drugs (Cocaine).  REVIEW OF SYSTEMS:   Unable.  Pt sedated on vent   SUBJECTIVE:   VITAL SIGNS:   Reviewed at bedside.  Abnormal results discussed in A/P  PHYSICAL EXAMINATION: General:  Chronically ill appearing female, appears critically ill  Neuro:  Sedated on propofol, RASS -3, eyes twitching/fluttering bilat, upward L gaze ?seizure HEENT:  Mm dry, no JVD, ETT  Cardiovascular:  s1s2 irreg Lungs:  resps even non labored on vent, few scattered rhonchi  Abdomen:  Round, mildly distended, R colostomy, hypoactive bs  Musculoskeletal:  Warm and dry, pale, no sig edema     Recent Labs Lab 08/25/14 0359 08/25/14 0630 08/30/2014 1045  NA 130* 136* 133*  K 4.2 4.7 4.0  CL 95* 98 100  CO2 18* 19 20  BUN 7 9 22   CREATININE  0.71 0.71 1.23*  GLUCOSE 140* 114* 98    Recent Labs Lab 08/25/14 0359 08/25/14 0630 09/06/2014 1045  HGB 10.2* 14.4 12.6  HCT 31.6* 43.1 37.1  WBC 13.5* 23.5* 14.7*  PLT 410* 619* 452*   Ct Head Wo Contrast  08/25/2014   CLINICAL DATA:  Respiratory failure. Fall previous day. Multiple seizures today.  EXAM: CT HEAD WITHOUT CONTRAST  TECHNIQUE: Contiguous axial images were obtained from the base of the skull through the vertex without intravenous contrast.   COMPARISON:  None.  FINDINGS: No acute intracranial abnormality. Specifically, no hemorrhage, hydrocephalus, mass lesion, acute infarction, or significant intracranial injury. No acute calvarial abnormality.  IMPRESSION: No acute intracranial abnormality.   Electronically Signed   By: Rolm Baptise M.D.   On: 08/25/2014 16:38   Dg Chest Port 1 View  08/25/2014   CLINICAL DATA:  Central line placement.  EXAM: PORTABLE CHEST - 1 VIEW  COMPARISON:  Chest x-ray 08/25/2014.  FINDINGS: New right upper extremity PICC with tip terminating at the superior cavoatrial junction. An endotracheal tube is in place with tip 3.9 cm above the carina. Lung volumes are low, and there are bibasilar opacities (left greater than right), favored to predominantly reflect atelectasis, although underlying airspace consolidation from infection or aspiration at the left base is not excluded. Small left pleural effusion. No evidence of pulmonary edema. Heart size is normal. The patient is rotated to the left on today's exam, resulting in distortion of the mediastinal contours and reduced diagnostic sensitivity and specificity for mediastinal pathology. Atherosclerosis in the thoracic aorta.  IMPRESSION: 1. Support apparatus, as above. 2. Persistent bibasilar opacities (left greater than right). Although this is predominantly atelectatic, opacification at the left base is more extensive suggesting superimposed airspace consolidation from infection or aspiration. There is also a small left pleural effusion.   Electronically Signed   By: Vinnie Langton M.D.   On: 08/25/2014 14:54   Dg Chest Port 1 View  08/25/2014   CLINICAL DATA:  Respiratory failure.  Ventilated.  EXAM: PORTABLE CHEST - 1 VIEW  COMPARISON:  08/12/2014  FINDINGS: An endotracheal tube has been placed with tip approximately 1 cm above the carina. Left PICC has been removed. Cardiac silhouette is upper limits of normal in size, unchanged. There is increased retrocardiac  opacity in the basilar left lower lobe, stable to slightly increased from prior. There is also mild right basilar opacity, slightly increased from prior. No definite pleural effusion or pneumothorax is identified. No acute osseous abnormality is identified.  IMPRESSION: 1. Endotracheal tube tip approximately 1 cm above the carina. Retraction by 2-3 cm recommended. 2. Left-greater-than-right basilar lung opacities, likely atelectasis although infection is possible in the left base. These results will be called to the ordering clinician or representative by the Radiologist Assistant, and communication documented in the PACS or zVision Dashboard.   Electronically Signed   By: Logan Bores   On: 08/25/2014 08:17    ASSESSMENT / PLAN:  VDRF - post VFib arrest with prolonged downtime.  VFib arrest - unclear etiology.  Troponin neg.  Has hx CAD with stent.  EF 25-30%.  No electrolyte abnormality noted prior to arrest. Hx TIA's.   CHF  Seizure - ?continued seizure activity  AKI HTN  REC -     Nickolas Madrid, NP 09/12/2014  12:41 PM Pager: [phone] or [phone]   Patient continues to actively seize.  Will transfer to the ICU in [facility].  Neurology is to see.  Will continue propofol for now as BP tolerates.  Will order an EEG upon arrival to the ICU.  Transfer to 3100.  Discussed with [facility]-MD and staff.  Continue keppra, dilantin and propofol.  The patient is critically ill with multiple organ systems failure and requires high complexity decision making for assessment and support, frequent evaluation and titration of therapies, application of advanced monitoring technologies and extensive interpretation of multiple databases.   Critical Care Time devoted to patient care services described in this note is  45  Minutes. This time reflects time of care of this signee Dr Jennet Maduro. This critical care time does not reflect procedure time, or teaching time or supervisory time of PA/NP/Med  student/Med Resident etc but could involve care discussion time.  Rush Farmer, M.D. [facility] Pulmonary/Critical Care Medicine. Pager: [phone]. After hours pager: [phone].

## 2014-08-26 NOTE — Progress Notes (Signed)
[facility] Progress Note Patient Name: Syriana Croslin DOB: 1961-04-13 MRN: [id number]   Date of Service  09/12/2014  HPI/Events of Note  Shock state, needs more propofol  eICU Interventions  Added vasopressors     Intervention Category Major Interventions: Shock - evaluation and management  Asencion Noble 09/04/2014, 7:21 PM

## 2014-08-26 NOTE — H&P (Signed)
PULMONARY / CRITICAL CARE MEDICINE   Name: Alyssa Gray MRN: [id number] DOB: 1961-04-06    ADMISSION DATE:  09/14/2014  REFERRING MD :  Dr. Laren Everts   CHIEF COMPLAINT:  Status Epilepticus post arrest   INITIAL PRESENTATION: 53 y/o F with PMH of CAD, CHF, HTN, Cocaine Abuse, PAD with mesenteric ischemia initially admitted to [facility] with abdominal pain and distention.  She underwent ex-lap on 11/17 for colonic mass resection.  Course complicated by CHF, R pleural effusion (s/p thora with 42ml off), and malnutrition on TPN.  She was tx to [facility] 11/24 for rehab.  On 12/9 was found unresponsive with initial rhythm of VF.  Required CPR for 35 minutes before ROSC.  Post arrest had witnessed seizures, started on Keppra, Dilantin & propofol but seizures continued.  PCCM consulted on 12/10 for evaluation and was transferred to [facility] ICU.    STUDIES:  12/10  ECHO >> EF 25-30%, severely reduced systolic function, grade 1 DD 12/10  CT HEAD >>    SIGNIFICANT EVENTS: 11/24  Admit to [facility]  12/09  VF cardiac arrest for 35 min before ROSC   HISTORY OF PRESENT ILLNESS:  53 y/o F with PMH of CAD, CHF, HTN, Cocaine Abuse, PAD with mesenteric ischemia initially admitted to [facility] with abdominal pain and distention.  She underwent ex-lap on 11/17 for colonic mass resection.  Course complicated by CHF, R pleural effusion (s/p thora with 467ml off), and malnutrition on TPN.  She was tx to [facility] 11/24 for rehab.  On 12/9 ~ 0400 was found unresponsive with initial rhythm of VF.  Required CPR for 35 minutes & intubation before ROSC.  Post arrest she had witnessed seizures and was started on Keppra, Dilantin & propofol but seizures continued.  PCCM consulted on 12/10 for evaluation and was transferred to [facility] ICU for neurological evaluation.     PAST MEDICAL HISTORY :   has a past medical history of Coronary artery disease; CHF (congestive heart failure); Hypertension; Cocaine abuse;  Stroke; Mesenteric ischemia; and Peripheral arterial disease.  has no past surgical history on file.   PRIOR MEDICATIONS:  Prior to Admission medications   Medication Sig Start Date End Date Taking? Authorizing Provider  clopidogrel (PLAVIX) 75 MG tablet Take 75 mg by mouth daily. 06/19/14   Historical Provider, MD  furosemide (LASIX) 40 MG tablet Take 40 mg by mouth daily. 06/19/14   Historical Provider, MD  isosorbide mononitrate (IMDUR) 120 MG 24 hr tablet Take 120 mg by mouth daily. 06/19/14   Historical Provider, MD  NITROSTAT 0.4 MG SL tablet Take 0.4 mg by mouth every 5 (five) minutes x 3 doses as needed. 07/16/14   Historical Provider, MD   No Known Allergies  FAMILY HISTORY: Unable to obtain from patient, subjective information obtained from previous medical documentation. has no family status information on file.    SOCIAL HISTORY:  Unable to obtain from patient, subjective information obtained from previous medical documentation.   reports that she has been smoking.  She does not have any smokeless tobacco history on file. She reports that she drinks alcohol. She reports that she uses illicit drugs (Cocaine).  REVIEW OF SYSTEMS:  Unable to obtain from patient, subjective information obtained from previous medical documentation. SUBJECTIVE:   VITAL SIGNS: Pulse Rate:  [87] 87 (12/10 1420) Resp:  [26] 26 (12/10 1420) SpO2:  [100 %] 100 % (12/10 1420) FiO2 (%):  [40 %] 40 % (12/10 1420)  HEMODYNAMICS:  VENTILATOR SETTINGS: Vent Mode:  [-] PRVC FiO2 (%):  [40 %] 40 % Set Rate:  [12 bmp] 12 bmp Vt Set:  [450 mL] 450 mL PEEP:  [5 cmH20] 5 cmH20   INTAKE / OUTPUT: No intake or output data in the 24 hours ending 08/27/2014 1446  PHYSICAL EXAMINATION: General:  Thin adult female, appears older than age Neuro:  Sedate on vent, no response to verbal stimuli  HEENT:  OETT, mm pink/moist, no JVD Cardiovascular:  s1s2 rrr, no m/r/g Lungs:  resp's even/non-labored, lungs  bilaterally coarse Abdomen:  Flat, soft, bsx4 hypoactive  Musculoskeletal:  No acute deformities  Skin:  Cool, dry, no edema.  LE mottling.   LABS:  CBC  Recent Labs Lab 08/25/14 0359 08/25/14 0630 09/14/2014 1045  WBC 13.5* 23.5* 14.7*  HGB 10.2* 14.4 12.6  HCT 31.6* 43.1 37.1  PLT 410* 619* 452*   Coag's No results for input(s): APTT, INR in the last 168 hours.   BMET  Recent Labs Lab 08/25/14 0359 08/25/14 0630 09/09/2014 1045  NA 130* 136* 133*  K 4.2 4.7 4.0  CL 95* 98 100  CO2 18* 19 20  BUN 7 9 22   CREATININE 0.71 0.71 1.23*  GLUCOSE 140* 114* 98   Electrolytes  Recent Labs Lab 08/20/14 0550  08/25/14 0359 08/25/14 0630 08/25/2014 1045  CALCIUM 7.9*  < > 8.4 8.8 7.7*  MG 1.9  --   --   --   --   PHOS 3.4  --   --   --   --   < > = values in this interval not displayed.   Sepsis Markers  Recent Labs Lab 08/25/14 0601  LATICACIDVEN 6.1*   ABG  Recent Labs Lab 08/25/14 0435 09/02/2014 1050  PHART 7.275* 7.443  PCO2ART 27.1* 32.2*  PO2ART 299.0* 92.9   Liver Enzymes  Recent Labs Lab 08/25/14 0630  AST 113*  ALT 62*  ALKPHOS 152*  BILITOT 0.3  ALBUMIN 1.9*   Cardiac Enzymes  Recent Labs Lab 08/25/14 0359 08/25/14 0630 08/25/14 1731  TROPONINI <0.30 <0.30 0.40*   Glucose No results for input(s): GLUCAP in the last 168 hours.  Imaging Ct Head Wo Contrast  08/25/2014   CLINICAL DATA:  Respiratory failure. Fall previous day. Multiple seizures today.  EXAM: CT HEAD WITHOUT CONTRAST  TECHNIQUE: Contiguous axial images were obtained from the base of the skull through the vertex without intravenous contrast.  COMPARISON:  None.  FINDINGS: No acute intracranial abnormality. Specifically, no hemorrhage, hydrocephalus, mass lesion, acute infarction, or significant intracranial injury. No acute calvarial abnormality.  IMPRESSION: No acute intracranial abnormality.   Electronically Signed   By: Rolm Baptise M.D.   On: 08/25/2014 16:38   Dg  Chest Port 1 View  08/25/2014   CLINICAL DATA:  Central line placement.  EXAM: PORTABLE CHEST - 1 VIEW  COMPARISON:  Chest x-ray 08/25/2014.  FINDINGS: New right upper extremity PICC with tip terminating at the superior cavoatrial junction. An endotracheal tube is in place with tip 3.9 cm above the carina. Lung volumes are low, and there are bibasilar opacities (left greater than right), favored to predominantly reflect atelectasis, although underlying airspace consolidation from infection or aspiration at the left base is not excluded. Small left pleural effusion. No evidence of pulmonary edema. Heart size is normal. The patient is rotated to the left on today's exam, resulting in distortion of the mediastinal contours and reduced diagnostic sensitivity and specificity for mediastinal pathology. Atherosclerosis in the  thoracic aorta.  IMPRESSION: 1. Support apparatus, as above. 2. Persistent bibasilar opacities (left greater than right). Although this is predominantly atelectatic, opacification at the left base is more extensive suggesting superimposed airspace consolidation from infection or aspiration. There is also a small left pleural effusion.   Electronically Signed   By: Vinnie Langton M.D.   On: 08/25/2014 14:54   Dg Chest Port 1 View  08/25/2014   CLINICAL DATA:  Respiratory failure.  Ventilated.  EXAM: PORTABLE CHEST - 1 VIEW  COMPARISON:  08/12/2014  FINDINGS: An endotracheal tube has been placed with tip approximately 1 cm above the carina. Left PICC has been removed. Cardiac silhouette is upper limits of normal in size, unchanged. There is increased retrocardiac opacity in the basilar left lower lobe, stable to slightly increased from prior. There is also mild right basilar opacity, slightly increased from prior. No definite pleural effusion or pneumothorax is identified. No acute osseous abnormality is identified.  IMPRESSION: 1. Endotracheal tube tip approximately 1 cm above the carina.  Retraction by 2-3 cm recommended. 2. Left-greater-than-right basilar lung opacities, likely atelectasis although infection is possible in the left base. These results will be called to the ordering clinician or representative by the Radiologist Assistant, and communication documented in the PACS or zVision Dashboard.   Electronically Signed   By: Logan Bores   On: 08/25/2014 08:17     ASSESSMENT / PLAN:   NEUROLOGIC A:   Status Epilepticus  Acute Encephalopathy - in setting of seizures, post arrest P:   RASS goal: 0 Propofol for sedation in the setting of active seizures Dilantin + Keppra for seizure  Now EEG Seizure precautions CT Head to ensure no acute bleeding  Assess CXR  PULMONARY OETT 12/9 >>  A: Acute Respiratory Failure - in setting of VF arrest with prolonged downtime.  Tobacco Abuse  HCAP  P:   IMV support Trend CXR See ID Duoneb + Q3 PRN albuterol   CARDIOVASCULAR CVL RUE PICC (?date) >>  A:  VF Arrest - cardiac arrest on 12/9 ~ 0400 CAD s/p Stent CHF - combined systolic / diastolic  HTN PVD Hx Cocaine Abuse P:  Hold anti-platelet / PVD rx until CT head to ensure no acute bleeding  ICU monitoring Will need to discuss goals of care with family, concerning for poor prognosis with prolonged arrest and seizures   RENAL A:   Hyponatremia  At Risk AKI - in setting of cardiac arrest  P:   Ensure adequate renal perfusion  NS @ 100 ml/hr  GASTROINTESTINAL A:   S/P Ex-Lap for Colon Mass - completed 11/17 [facility]  P:   NPO Begin TF PPI   HEMATOLOGIC A:   Leukocytosis  Thrombocytosis  P:  Monitor CBC   INFECTIOUS A:   HCAP  P:   BCx2 12/10 >>  UC 12/10 >> UA 12/10 >>  Sputum 12/10 >>  Abx: Vanco, start date 12/10, day 1/x Abx: Zosyn, start date 12/10, day 1/x   ENDOCRINE A:   Hyperglycemia   P:   SSI   FAMILY  - Updates: No family at bedside.   - Inter-disciplinary family meet or Palliative Care meeting  due by:     Noe Gens, NP-C [place] Pgr: [phone] or [phone]  Patient arrested on 4 AM on 12/9, PCCM called 12/10, moved down to ICU for neuro to eval, she is clearly seizing on exam, neuro called on consultation, anti-epileptics added, pan cultured  and vanc/zosyn as ordered.  Will place on propofol coma.  If BP drops then will used pressors to assist.  Will need neuro's prognostication given duration of code and duration of seizure activity.  The patient is critically ill with multiple organ systems failure and requires high complexity decision making for assessment and support, frequent evaluation and titration of therapies, application of advanced monitoring technologies and extensive interpretation of multiple databases.   Critical Care Time devoted to patient care services described in this note is  35  Minutes. This time reflects time of care of this signee Dr Jennet Maduro. This critical care time does not reflect procedure time, or teaching time or supervisory time of PA/NP/Med student/Med Resident etc but could involve care discussion time.  Rush Farmer, M.D. [facility] Pulmonary/Critical Care Medicine. Pager: [phone]. After hours pager: [phone].  09/07/2014, 2:46 PM

## 2014-08-26 NOTE — Progress Notes (Signed)
BEst practice  CT head - no acute abnormality  Recent Labs Lab 08/25/14 0630 09/16/2014 1045 09/03/2014 1645  HGB 14.4 12.6 11.1*  HCT 43.1 37.1 33.4*  WBC 23.5* 14.7* 14.1*  PLT 619* 452* 397    Recent Labs Lab 08/24/2014 1645  INR 1.25    PLAN  - start SQ heparin  - start protonix  Dr. Brand Males, M.D., [facility].C.P Pulmonary and Critical Care Medicine Staff Physician [place] Pulmonary and Critical Care Pager: [phone], If no answer or between  15:00h - 7:00h: call [phone]  08/25/2014 5:25 PM

## 2014-08-26 NOTE — Procedures (Signed)
Central Venous Catheter Insertion Procedure Note [place] Canty [id number] 10-Jul-1961  Procedure: Insertion of Central Venous Catheter Indications: Assessment of intravascular volume, Drug and/or fluid administration and Frequent blood sampling  Procedure Details Consent: Unable to obtain consent because of altered level of consciousness. Time Out: Verified patient identification, verified procedure, site/side was marked, verified correct patient position, special equipment/implants available, medications/allergies/relevent history reviewed, required imaging and test results available.  Performed  Maximum sterile technique was used including antiseptics, cap, gloves, gown, hand hygiene, mask and sheet. Skin prep: Chlorhexidine; local anesthetic administered A antimicrobial bonded/coated triple lumen catheter was placed in the right internal jugular vein using the Seldinger technique. Attempted to avoid RIJ due to R PICC, however LIJ was very small on ultrasound and very difficult to cannulate for that reason.  Ultrasound guidance used.Yes.   Catheter placed to 15 cm. Blood aspirated via all 3 ports and then flushed x 3. Line sutured x 2 and dressing applied.  Evaluation Blood flow good Complications: No apparent complications Patient did tolerate procedure well. Chest X-ray ordered to verify placement.  CXR: pending.  Georgann Housekeeper, ACNP [facility] Pulmonology/Critical Care Pager [phone] or [phone]   Merton Border, MD ; [facility] service Mobile [phone].  After 5:30 PM or weekends, call [phone]

## 2014-08-26 NOTE — Progress Notes (Addendum)
INITIAL NUTRITION ASSESSMENT  Pt meets criteria for SEVERE MALNUTRITION in the context of acute illness as evidenced by severe fat and muscle depletion and 12% weight loss x 2 weeks.  DOCUMENTATION CODES Per approved criteria  -Severe malnutrition in the context of acute illness or injury -Underweight   INTERVENTION: Initiate Vital AF 1.2 @ 15 ml/hr via OG tube and increase by 10 ml every 12 hours to goal rate of 45 ml/hr. - will advance slowly as it is unclear what pt was receiving nutritionally prior to her [facility] admission.   Monitor magnesium, potassium, and phosphorus daily for at least 3 days, MD to replete as needed, as pt is at risk for refeeding syndrome given severe malnutrition.  Tube feeding regimen provides 1296 kcal, 81 grams of protein, and 875 ml of H2O.   TF regimen and propofol at current rate providing 1504 total kcal/day (100 % of kcal needs)  NUTRITION DIAGNOSIS: Inadequate oral intake related to inability to eat as evidenced by NPO status  Goal: Pt to meet >/= 90% of their estimated nutrition needs   Monitor:  Vent status, TF initiation and tolerance   Reason for Assessment: Consult received to initiate and manage enteral nutrition support.  53 y.o. female  Admitting Dx: Arrest  ASSESSMENT: Pt with hx CAD s/p stent ( EF 25-30%), HTN, hx cocaine abuse PAD with mesenteric ischemia initially admitted to [facility] with abd pain/distention. Ultimately underwent exploratory lap 11/17 for colonic mass resection. Course c/b CHF, R pleural effusion (s/p thoracentesis with 481ml off), and malnutrition on TPN. Was tx 11/24 to [facility] for rehab. On 12/9 was found unresponsive, initial rhythm VFib at 0400 hours. Had CPR for total 35 minutes before ROSC. Pt with seizures post arrest.   Patient is currently intubated on ventilator support MV: 12.4 L/min Temp (24hrs), Avg:99.7 F (37.6 C), Min:99.7 F (37.6 C), Max:99.7 F (37.6 C)  Propofol: 7.9  ml/hr providing 208 kcal per day from lipid  Spoke with CCM NP, OK to try enteral nutrition support.  Pt currently having EEG performed.  Pt on [facility] for rehab, per RN pt had been there x 10 days. Pt was on TPN on admission to [facility] It is unclear what nutrition pt was receiving recently. Unable to reach [facility] RD or RN at this time. Message left.   Nutrition Focused Physical Exam:  Subcutaneous Fat:  Orbital Region: NA Upper Arm Region: severe depletion  Thoracic and Lumbar Region: severe depletion   Muscle:  Temple Region: severe depletion Clavicle Bone Region: severe depletion Clavicle and Acromion Bone Region: severe depletion Scapular Bone Region: NA Dorsal Hand: NA Patellar Region: severe depletion Anterior Thigh Region: severe depletion Posterior Calf Region: severe depletion   Edema: not present  Height: Ht Readings from Last 1 Encounters:  08/11/14 5\' 5"  (1.651 m)    Weight: Wt Readings from Last 1 Encounters:  08/19/2014 106 lb 14.8 oz (48.5 kg)    Ideal Body Weight: 56.8 kg   % Ideal Body Weight: 85%  Wt Readings from Last 10 Encounters:  09/02/2014 106 lb 14.8 oz (48.5 kg)  08/11/14 120 lb (54.432 kg)    Usual Body Weight: 120 lb   % Usual Body Weight: 88%  BMI:  Body mass index is 17.79 kg/(m^2).  Estimated Nutritional Needs: Kcal: 1497 Protein: 75-100 grams Fluid: > 1.5 L/day  Skin: abd incision  Diet Order:    EDUCATION NEEDS: -No education needs identified at this time  No intake or output data  in the 24 hours ending 09/06/2014 1517  Last BM: PTA   Labs:   Recent Labs Lab 08/20/14 0550  08/25/14 0359 08/25/14 0630 08/25/2014 1045  NA 139  < > 130* 136* 133*  K 3.6*  < > 4.2 4.7 4.0  CL 104  < > 95* 98 100  CO2 25  < > 18* 19 20  BUN 10  < > 7 9 22   CREATININE 0.61  < > 0.71 0.71 1.23*  CALCIUM 7.9*  < > 8.4 8.8 7.7*  MG 1.9  --   --   --   --   PHOS 3.4  --   --   --   --   GLUCOSE 87  < > 140* 114* 98  < > = values in this  interval not displayed.  CBG (last 3)  No results for input(s): GLUCAP in the last 72 hours.  Scheduled Meds: . amiodarone  200 mg Oral BID  . antiseptic oral rinse  7 mL Mouth Rinse QID  . chlorhexidine  15 mL Mouth Rinse BID  . insulin aspart  2-6 Units Subcutaneous 6 times per day  . levETIRAcetam  1,000 mg Intravenous Q12H  . phenytoin (DILANTIN) IV  100 mg Intravenous 3 times per day    Continuous Infusions: . sodium chloride    . sodium chloride    . propofol      Past Medical History  Diagnosis Date  . Coronary artery disease   . CHF (congestive heart failure)   . Hypertension   . Cocaine abuse   . Stroke     TIAs  . Mesenteric ischemia   . Peripheral arterial disease   . Acute MI     Past Surgical History  Procedure Laterality Date  . Exploratory laparotomy      [place], [place], [place] Pager [phone] After Hours Pager

## 2014-08-26 NOTE — Progress Notes (Signed)
Two RT's attempted to place arterial line unable to thread with successful blood return. RT's advised the RN and NP Eddie Dibbles.

## 2014-08-26 NOTE — Procedures (Signed)
EEG report.  Brief clinical history: 53 YO female initially presenting to [facility] S/P colonic mass resection for rehabilitation. Patient found in cardiac arrest 12/9 at 0400 hours and underwent 35 minutes CPR prior to ROSC. S/P arrest patient noted to have seizure activity and was started on Keppra, dilantin and propofol. Question of how long seizure activity was ongoing --possibly 24 hours?. Today noted to have continued seizure activity by PCCM and transferred to [facility] for further evaluation. Currently no seizure activity noted on exam. Neurology consulted for both seizure and prognostication. Currently on 1000 mg Keppra IV BID and Dilantin 100 mg TID.  Technique: this is a 17 channel routine scalp EEG performed at the bedside in the ICU setting with bipolar and monopolar montages arranged in accordance to the international 10/20 system of electrode placement. One channel was dedicated to EKG recording.  Patient is intubated on the vent, sedated with propofol No activating procedures.  Description: as the study begins, there is evidence of generalized epileptiform discharges that few seconds later become high amplitude, rhythmic generalized spikes and polyspike and waves occurring with a frequency of 1-2 Hz per second with an ictal pattern that seems to emanate from the left hemisphere and rapidly becomes more generalized. Persistent electrographic seizures occur throughout the first half of the recording and subsequently converts into generalized slowing with bursts of generalized epileptiform discharges.   EKG showed sinus rhythm.   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 Clinical correlation is advised.   Dorian Pod, MD

## 2014-08-26 NOTE — Progress Notes (Signed)
   RN requesting flexiseal for   Plan flexiseal

## 2014-08-26 NOTE — Progress Notes (Signed)
EEG completed, results pending. 

## 2014-08-26 NOTE — Progress Notes (Deleted)
CONSULT NOTE - INITIAL  Pharmacy Consult for vancomycin, zosyn, phenytoin Indication: PNA  No Known Allergies  Patient Measurements: Weight: 106 lb 14.8 oz (48.5 kg)   Vital Signs: Temp: 99.7 F (37.6 C) (12/10 1430) Temp Source: Oral (12/10 1430) BP: 128/58 mmHg (12/10 1445) Pulse Rate: 66 (12/10 1445) Intake/Output from previous day:   Intake/Output from this shift:    Labs:  Recent Labs  08/25/14 0359 08/25/14 0630 09/02/2014 1045  WBC 13.5* 23.5* 14.7*  HGB 10.2* 14.4 12.6  PLT 410* 619* 452*  CREATININE 0.71 0.71 1.23*   Estimated Creatinine Clearance: 40 mL/min (by C-G formula based on Cr of 1.23). No results for input(s): VANCOTROUGH, VANCOPEAK, VANCORANDOM, GENTTROUGH, GENTPEAK, GENTRANDOM, TOBRATROUGH, TOBRAPEAK, TOBRARND, AMIKACINPEAK, AMIKACINTROU, AMIKACIN in the last 72 hours.   Microbiology: Recent Results (from the past 720 hour(s))  Stool culture     Status: None   Collection Time: 08/11/14  3:35 AM  Result Value Ref Range Status   Specimen Description STOOL  Final   Special Requests NONE  Final   Culture   Final    NO SALMONELLA, SHIGELLA, CAMPYLOBACTER, YERSINIA, OR E.COLI 0157:[id number] ISOLATED Performed at [facility]    Report Status 08/15/2014 FINAL  Final  Clostridium Difficile by PCR     Status: None   Collection Time: 08/14/14  4:07 PM  Result Value Ref Range Status   C difficile by pcr NEGATIVE NEGATIVE Final    Medical History: Past Medical History  Diagnosis Date  . Coronary artery disease   . CHF (congestive heart failure)   . Hypertension   . Cocaine abuse   . Stroke     TIAs  . Mesenteric ischemia   . Peripheral arterial disease   . Acute MI     Medications:  Prescriptions prior to admission  Medication Sig Dispense Refill Last Dose  . clopidogrel (PLAVIX) 75 MG tablet Take 75 mg by mouth daily.  1 08/10/2014 at Unknown time  . furosemide (LASIX) 40 MG tablet Take 40 mg by mouth daily.  5 08/10/2014 at Unknown  time  . isosorbide mononitrate (IMDUR) 120 MG 24 hr tablet Take 120 mg by mouth daily.  2 08/10/2014 at Unknown time  . NITROSTAT 0.4 MG SL tablet Take 0.4 mg by mouth every 5 (five) minutes x 3 doses as needed.  0 08/10/2014 at Unknown time   Scheduled:  . amiodarone  200 mg Oral BID  . insulin aspart  2-6 Units Subcutaneous 6 times per day  . levETIRAcetam  1,000 mg Intravenous Q12H     Assessment: 53 yo female with VDRF s/p Vfib arrest and noted with seizure post arrest. Pharmacy consulted to dose vancomycin and zosyn for PNA and phenytoin for seizure. WBC= 14.7, tmax= 99.7, SCr= 1.23 and CrCl ~ 40. Patient was started on [facility]yn at [facility] 12/9 (vancomycin 1gm last pm at 2200; last zosyn dose was today at noon).   12/9 vanc 12/9 zosyn  12/10 urine 12/10 blood x2  Phenytoin level= 6.2 on 12/10 (albumin= 1.9 on 12/9 and corrected phenytoin ~ 12.9). Per records from [facility], phenytoin 100mg  IV tid was started on 08/25/14. Per conversation with [facility] the patient received a load of 600mg  phenytoin on 08/25/14. Patient also noted on Keppra 1000mg  IV q12h  Phenytoin requirements estimated at 200-300mg  IV daily. With patient weight, phenytoin 100mg  IV bid may provide adequate coverage.  Spoke with Etta Quill PA: cont phenytion 100mg  IV tid and continue to follow phenytoin levels (  phenytoin level in am per MD)  Goal of Therapy:  Vancomycin trough level 15-20 mcg/ml  Plan:  -Zosyn 3.375mg  IV q8h -Vancomycin 750mg  IV q24h -Phenytoin management per Neuro -Will follow renal function, cultures and clinical progress  Hildred Laser, Pharm D 09/12/2014 3:39 PM

## 2014-08-26 NOTE — Consult Note (Signed)
NEURO HOSPITALIST CONSULT NOTE    Reason for Consult: seizure and prognostication.   HPI:                                                                                                                                          Alyssa Gray is an 53 y.o. female with hx CAD s/p stent ( EF 25-30%), HTN, hx cocaine abuse PAD with mesenteric ischemia initially admitted to [facility] with abd pain/distention. Ultimately underwent exploratory lap 11/17 for colonic mass resection. Course c/b CHF, R pleural effusion (s/p thoracentesis with 480ml off), and malnutrition on TPN. Was tx 11/24 to [facility] for rehab. On 12/9 was found unresponsive, initial rhythm VFib at 0400 hours. Had CPR for total 35 minutes before ROSC.  Patient was no placed on cooling protocol. Post arrest had witnessed seizures, started on keppra 1000 BID, dilantin 100 TID and propofol 20 Mcg/KG/Min gtt.current dilantin level is 12.9 corrected.  Currently she has been moved to 2h08 and shows no active seizure activity.   Past Medical History  Diagnosis Date  . Coronary artery disease   . CHF (congestive heart failure)   . Hypertension   . Cocaine abuse   . Stroke     TIAs  . Mesenteric ischemia   . Peripheral arterial disease   . Acute MI     Past Surgical History  Procedure Laterality Date  . Exploratory laparotomy      No family history on file.  Family History: Unable to obtain  Social History:  reports that she has been smoking.  She does not have any smokeless tobacco history on file. She reports that she drinks alcohol. She reports that she uses illicit drugs (Cocaine).  No Known Allergies  MEDICATIONS:                                                                                                                     Scheduled: . amiodarone  200 mg Oral BID  . insulin aspart  2-6 Units Subcutaneous 6 times per day  . levETIRAcetam  1,000 mg Intravenous Q12H  .  phenytoin (DILANTIN) IV  100 mg Intravenous 3 times per day     ROS:  History obtained from unobtainable from patient due to mental status    Blood pressure 128/58, pulse 66, temperature 99.7 F (37.6 C), temperature source Oral, resp. rate 12, weight 48.5 kg (106 lb 14.8 oz), SpO2 97 %.   Neurologic Examination:                                                                                                      Physical Exam  Constitutional: He appears well-developed and well-nourished.  Eyes: No scleral injection HENT: No OP obstrucion Head: Normocephalic.  Cardiovascular: Normal rate and regular rhythm.  Respiratory:  breath sounds normal.  GI: Soft. Bowel sounds are normal. No distension. There is no tenderness.  Skin: WDI  Neuro Exam: Mental Status: Patient does not respond to verbal stimuli.  Does not respond to deep sternal rub.  Does not follow commands.  No verbalizations noted. Intubated and breathing over the ventilator.  Cranial Nerves: II: patient does not respond confrontation bilaterally and eyes disconjugate, pupils right 2 mm, left 2 mm,and reactive bilaterally III,IV,VI: doll's response absent bilaterally. V,VII: corneal reflex present bilaterally  VIII: patient does not respond to verbal stimuli IX,X: gag reflex absent, XI: trapezius strength unable to test bilaterally XII: tongue strength unable to test Motor: Extremities flaccid throughout.  No spontaneous movement noted.  Right arm moves minimally to pain Sensory: As above with triple reflex noted to bilateral LE noxious stimuli Deep Tendon Reflexes:  2+ bilateral UE and KJ no AJ. Plantars: absent bilaterally Cerebellar: Unable to perform     Lab Results: Basic Metabolic Panel:  Recent Labs Lab 08/20/14 0550  08/22/14 0635 08/23/14 0500 08/25/14 0357  08/25/14 0359 08/25/14 0630 09/04/2014 1045  NA 139  < > 136* 139 131* 130* 136* 133*  K 3.6*  < > 3.6* 4.2 3.8 4.2 4.7 4.0  CL 104  < > 100 100 101 95* 98 100  CO2 25  < > 28 27  --  18* 19 20  GLUCOSE 87  < > 76 69* 140* 140* 114* 98  BUN 10  < > 7 7 6 7 9 22   CREATININE 0.61  < > 0.68 0.67 0.70 0.71 0.71 1.23*  CALCIUM 7.9*  < > 8.3* 8.3*  --  8.4 8.8 7.7*  MG 1.9  --   --   --   --   --   --   --   PHOS 3.4  --   --   --   --   --   --   --   < > = values in this interval not displayed.  Liver Function Tests:  Recent Labs Lab 08/25/14 0630  AST 113*  ALT 62*  ALKPHOS 152*  BILITOT 0.3  PROT 6.6  ALBUMIN 1.9*   No results for input(s): LIPASE, AMYLASE in the last 168 hours. No results for input(s): AMMONIA in the last 168 hours.  CBC:  Recent Labs Lab 08/20/14 0550 08/20/14 1055 08/21/14 0550 08/22/14 0635 08/23/14 0500 08/25/14 0357 08/25/14 0359 08/25/14 0630 09/03/2014 1045  WBC 6.0 5.5 7.4 7.4  7.5  --  13.5* 23.5* 14.7*  NEUTROABS 3.7 3.4 4.5  --   --   --   --   --   --   HGB 6.7* 7.0* 8.3* 10.0* 10.3* 10.5* 10.2* 14.4 12.6  HCT 20.3* 20.8* 24.8* 29.9* 30.9* 31.0* 31.6* 43.1 37.1  MCV 89.8 91.6 86.4 89.0 89.8  --  89.8 89.0 87.9  PLT 419* 430* 453* 435* 482*  --  410* 619* 452*    Cardiac Enzymes:  Recent Labs Lab 08/25/14 0359 08/25/14 0630 08/25/14 1731  TROPONINI <0.30 <0.30 0.40*    Lipid Panel: No results for input(s): CHOL, TRIG, HDL, CHOLHDL, VLDL, LDLCALC in the last 168 hours.  CBG: No results for input(s): GLUCAP in the last 168 hours.  Microbiology: Results for orders placed or performed during the hospital encounter of 08/11/14  Clostridium Difficile by PCR     Status: None   Collection Time: 08/14/14  4:07 PM  Result Value Ref Range Status   C difficile by pcr NEGATIVE NEGATIVE Final    Coagulation Studies: No results for input(s): LABPROT, INR in the last 72 hours.  Imaging: Ct Head Wo Contrast  08/25/2014   CLINICAL  DATA:  Respiratory failure. Fall previous day. Multiple seizures today.  EXAM: CT HEAD WITHOUT CONTRAST  TECHNIQUE: Contiguous axial images were obtained from the base of the skull through the vertex without intravenous contrast.  COMPARISON:  None.  FINDINGS: No acute intracranial abnormality. Specifically, no hemorrhage, hydrocephalus, mass lesion, acute infarction, or significant intracranial injury. No acute calvarial abnormality.  IMPRESSION: No acute intracranial abnormality.   Electronically Signed   By: Rolm Baptise M.D.   On: 08/25/2014 16:38   Dg Chest Port 1 View  08/25/2014   CLINICAL DATA:  Central line placement.  EXAM: PORTABLE CHEST - 1 VIEW  COMPARISON:  Chest x-ray 08/25/2014.  FINDINGS: New right upper extremity PICC with tip terminating at the superior cavoatrial junction. An endotracheal tube is in place with tip 3.9 cm above the carina. Lung volumes are low, and there are bibasilar opacities (left greater than right), favored to predominantly reflect atelectasis, although underlying airspace consolidation from infection or aspiration at the left base is not excluded. Small left pleural effusion. No evidence of pulmonary edema. Heart size is normal. The patient is rotated to the left on today's exam, resulting in distortion of the mediastinal contours and reduced diagnostic sensitivity and specificity for mediastinal pathology. Atherosclerosis in the thoracic aorta.  IMPRESSION: 1. Support apparatus, as above. 2. Persistent bibasilar opacities (left greater than right). Although this is predominantly atelectatic, opacification at the left base is more extensive suggesting superimposed airspace consolidation from infection or aspiration. There is also a small left pleural effusion.   Electronically Signed   By: Vinnie Langton M.D.   On: 08/25/2014 14:54   Dg Chest Port 1 View  08/25/2014   CLINICAL DATA:  Respiratory failure.  Ventilated.  EXAM: PORTABLE CHEST - 1 VIEW  COMPARISON:   08/12/2014  FINDINGS: An endotracheal tube has been placed with tip approximately 1 cm above the carina. Left PICC has been removed. Cardiac silhouette is upper limits of normal in size, unchanged. There is increased retrocardiac opacity in the basilar left lower lobe, stable to slightly increased from prior. There is also mild right basilar opacity, slightly increased from prior. No definite pleural effusion or pneumothorax is identified. No acute osseous abnormality is identified.  IMPRESSION: 1. Endotracheal tube tip approximately 1 cm above the carina. Retraction  by 2-3 cm recommended. 2. Left-greater-than-right basilar lung opacities, likely atelectasis although infection is possible in the left base. These results will be called to the ordering clinician or representative by the Radiologist Assistant, and communication documented in the PACS or zVision Dashboard.   Electronically Signed   By: Logan Bores   On: 08/25/2014 08:17     Assessment and plan per attending neurologist  Etta Quill PA-C [facility] [phone]  09/09/2014, 3:12 PM   Assessment/Plan:  53 YO female initially presenting to [facility] S/P colonic mass resection for rehabilitation. Patient found in cardiac arrest 12/9 at 0400 hours and underwent 35 minutes CPR prior to ROSC.  S/P arrest patient noted to have seizure activity and was started on Keppra, dilantin and propofol.  Question of how long seizure activity was ongoing --possibly 24 hours?. Today noted to have continued seizure activity by PCCM and transferred to [facility] for further evaluation. Currently no seizure activity noted on exam.  Neurology consulted for both seizure and prognostication. Currently on 1000 mg Keppra IV BID and Dilantin 100 mg TID.   Recommend: 1) Continue current AED 2) EEG--currently being done 3) repeat head CT 4) seizure precautions.   Will follow and make further recommendations after EEG and head CT.    I personally participated in this patient's evaluation and management, including formulating above clinical impression and management recommendations.  Rush Farmer M.D. [facility] [phone]

## 2014-08-27 ENCOUNTER — Inpatient Hospital Stay (HOSPITAL_COMMUNITY): Payer: Medicare Other

## 2014-08-27 LAB — BASIC METABOLIC PANEL
ANION GAP: 14 (ref 5–15)
Anion gap: 16 — ABNORMAL HIGH (ref 5–15)
BUN: 20 mg/dL (ref 6–23)
BUN: 21 mg/dL (ref 6–23)
CHLORIDE: 98 meq/L (ref 96–112)
CO2: 18 mEq/L — ABNORMAL LOW (ref 19–32)
CO2: 19 mEq/L (ref 19–32)
Calcium: 6.8 mg/dL — ABNORMAL LOW (ref 8.4–10.5)
Calcium: 7.3 mg/dL — ABNORMAL LOW (ref 8.4–10.5)
Chloride: 102 mEq/L (ref 96–112)
Creatinine, Ser: 1.11 mg/dL — ABNORMAL HIGH (ref 0.50–1.10)
Creatinine, Ser: 1.12 mg/dL — ABNORMAL HIGH (ref 0.50–1.10)
GFR calc Af Amer: 63 mL/min — ABNORMAL LOW (ref >90)
GFR, EST AFRICAN AMERICAN: 64 mL/min — AB (ref >90)
GFR, EST NON AFRICAN AMERICAN: 55 mL/min — AB (ref >90)
GFR, EST NON AFRICAN AMERICAN: 55 mL/min — AB (ref >90)
Glucose, Bld: 162 mg/dL — ABNORMAL HIGH (ref 70–99)
Glucose, Bld: 209 mg/dL — ABNORMAL HIGH (ref 70–99)
Potassium: 3 mEq/L — ABNORMAL LOW (ref 3.7–5.3)
Potassium: 3.2 mEq/L — ABNORMAL LOW (ref 3.7–5.3)
SODIUM: 136 meq/L — AB (ref 137–147)
Sodium: 131 mEq/L — ABNORMAL LOW (ref 137–147)

## 2014-08-27 LAB — BLOOD GAS, ARTERIAL
ACID-BASE DEFICIT: 5.3 mmol/L — AB (ref 0.0–2.0)
Bicarbonate: 18.6 mEq/L — ABNORMAL LOW (ref 20.0–24.0)
DRAWN BY: 41308
FIO2: 0.5 %
O2 SAT: 92.9 %
PATIENT TEMPERATURE: 98
PEEP: 5 cmH2O
RATE: 24 resp/min
TCO2: 19.5 mmol/L (ref 0–100)
VT: 450 mL
pCO2 arterial: 30 mmHg — ABNORMAL LOW (ref 35.0–45.0)
pH, Arterial: 7.408 (ref 7.350–7.450)
pO2, Arterial: 67.2 mmHg — ABNORMAL LOW (ref 80.0–100.0)

## 2014-08-27 LAB — GLUCOSE, CAPILLARY
GLUCOSE-CAPILLARY: 60 mg/dL — AB (ref 70–99)
Glucose-Capillary: 135 mg/dL — ABNORMAL HIGH (ref 70–99)
Glucose-Capillary: 125 mg/dL — ABNORMAL HIGH (ref 70–99)
Glucose-Capillary: 128 mg/dL — ABNORMAL HIGH (ref 70–99)
Glucose-Capillary: 97 mg/dL (ref 70–99)
Glucose-Capillary: 91 mg/dL (ref 70–99)
Glucose-Capillary: 96 mg/dL (ref 70–99)

## 2014-08-27 LAB — URINE CULTURE
Colony Count: NO GROWTH
Culture: NO GROWTH

## 2014-08-27 LAB — CBC
HCT: 32.1 % — ABNORMAL LOW (ref 36.0–46.0)
HCT: 32.1 % — ABNORMAL LOW (ref 36.0–46.0)
HEMOGLOBIN: 10.5 g/dL — AB (ref 12.0–15.0)
Hemoglobin: 10.6 g/dL — ABNORMAL LOW (ref 12.0–15.0)
MCH: 28.8 pg (ref 26.0–34.0)
MCH: 29.1 pg (ref 26.0–34.0)
MCHC: 32.7 g/dL (ref 30.0–36.0)
MCHC: 33 g/dL (ref 30.0–36.0)
MCV: 87.9 fL (ref 78.0–100.0)
MCV: 88.2 fL (ref 78.0–100.0)
Platelets: 358 10*3/uL (ref 150–400)
Platelets: 412 10*3/uL — ABNORMAL HIGH (ref 150–400)
RBC: 3.64 MIL/uL — AB (ref 3.87–5.11)
RBC: 3.65 MIL/uL — AB (ref 3.87–5.11)
RDW: 15.2 % (ref 11.5–15.5)
RDW: 15.3 % (ref 11.5–15.5)
WBC: 14.4 10*3/uL — ABNORMAL HIGH (ref 4.0–10.5)
WBC: 23.7 10*3/uL — AB (ref 4.0–10.5)

## 2014-08-27 LAB — CORTISOL: CORTISOL PLASMA: 22 ug/dL

## 2014-08-27 LAB — MAGNESIUM
MAGNESIUM: 1.8 mg/dL (ref 1.5–2.5)
Magnesium: 3.5 mg/dL — ABNORMAL HIGH (ref 1.5–2.5)

## 2014-08-27 LAB — PHOSPHORUS
PHOSPHORUS: 3.7 mg/dL (ref 2.3–4.6)
PHOSPHORUS: 3.9 mg/dL (ref 2.3–4.6)

## 2014-08-27 LAB — CLOSTRIDIUM DIFFICILE BY PCR: Toxigenic C. Difficile by PCR: NEGATIVE

## 2014-08-27 LAB — PHENYTOIN LEVEL, TOTAL: Phenytoin Lvl: 3.4 ug/mL — ABNORMAL LOW (ref 10.0–20.0)

## 2014-08-27 MED ORDER — SODIUM CHLORIDE 0.9 % IV SOLN: INTRAVENOUS | Status: DC

## 2014-08-27 MED ORDER — LORAZEPAM 2 MG/ML IJ SOLN
4.0000 mg | Freq: Once | INTRAMUSCULAR | Status: AC
  Administered 2014-08-27: 4 mg via INTRAVENOUS

## 2014-08-27 MED ORDER — LORAZEPAM 2 MG/ML IJ SOLN
INTRAMUSCULAR | Status: AC
  Filled 2014-08-27: qty 1

## 2014-08-27 MED ORDER — FENTANYL CITRATE 0.05 MG/ML IJ SOLN: 50.0000 ug | INTRAMUSCULAR | Status: DC | PRN

## 2014-08-27 MED ORDER — DEXTROSE 50 % IV SOLN
INTRAVENOUS | Status: AC
  Administered 2014-08-27: 50 mL via INTRAVENOUS
  Filled 2014-08-27: qty 50

## 2014-08-27 MED ORDER — POTASSIUM CHLORIDE 20 MEQ/15ML (10%) PO SOLN
40.0000 meq | Freq: Once | ORAL | Status: AC
  Administered 2014-08-27: 40 meq
  Filled 2014-08-27 (×2): qty 30

## 2014-08-27 MED ORDER — SODIUM CHLORIDE 0.9 % IJ SOLN: 10.0000 mL | INTRAMUSCULAR | Status: DC | PRN

## 2014-08-27 MED ORDER — NOREPINEPHRINE BITARTRATE 1 MG/ML IV SOLN
2.0000 ug/min | INTRAVENOUS | Status: DC
  Administered 2014-08-27: 6 ug/min via INTRAVENOUS
  Filled 2014-08-27: qty 16

## 2014-08-27 MED ORDER — AMIODARONE HCL IN DEXTROSE 360-4.14 MG/200ML-% IV SOLN: 30.0000 mg/h | INTRAVENOUS | Status: DC

## 2014-08-27 MED ORDER — POTASSIUM CHLORIDE 10 MEQ/50ML IV SOLN
10.0000 meq | INTRAVENOUS | Status: AC
  Administered 2014-08-27 (×4): 10 meq via INTRAVENOUS
  Filled 2014-08-27 (×2): qty 50

## 2014-08-27 MED ORDER — FAMOTIDINE 40 MG/5ML PO SUSR
20.0000 mg | Freq: Two times a day (BID) | ORAL | Status: DC
  Administered 2014-08-27 – 2014-08-28 (×3): 20 mg
  Filled 2014-08-27 (×4): qty 2.5

## 2014-08-27 MED ORDER — DEXTROSE 50 % IV SOLN
50.0000 mL | Freq: Once | INTRAVENOUS | Status: AC
  Administered 2014-08-27: 50 mL via INTRAVENOUS
  Filled 2014-08-27: qty 50

## 2014-08-27 MED ORDER — AMIODARONE HCL IN DEXTROSE 360-4.14 MG/200ML-% IV SOLN
30.0000 mg/h | INTRAVENOUS | Status: DC
Start: 2014-08-27 — End: 2014-08-28
  Administered 2014-08-27 (×2): 30 mg/h via INTRAVENOUS
  Filled 2014-08-27 (×6): qty 200

## 2014-08-27 MED ORDER — FREE WATER
100.0000 mL | Freq: Three times a day (TID) | Status: DC
  Administered 2014-08-27 – 2014-08-28 (×4): 100 mL

## 2014-08-27 MED ORDER — SODIUM CHLORIDE 0.9 % IJ SOLN
10.0000 mL | Freq: Two times a day (BID) | INTRAMUSCULAR | Status: DC
  Administered 2014-08-27 – 2014-08-28 (×2): 10 mL

## 2014-08-27 NOTE — Progress Notes (Signed)
[facility] Progress Note Patient Name: Alyssa Gray DOB: [birth year] MRN: [id number]   Date of Service  08/27/2014  HPI/Events of Note  Assisted in code, prim arrytnmia VFib acls followed, mag, amio  eICU Interventions  Poor prognosis, fam spoken too by [employer]     Intervention Category Major Interventions: Code management / supervision  Raylene Miyamoto. 08/27/2014, 12:34 AM

## 2014-08-27 NOTE — Progress Notes (Signed)
Late Entry: 09/05/2014- spoke with Dr. Leonel Ramsay at [phone]. At those times verbal orders received to bolus patient with diprivan. See Mar. Also increased infusion to 50 then 70 mcg/kg per orders. Increasing the diprivan required the need for levophed which was obtained though verbal order from Dr. Joya Gaskins. C-diff sample sent due to patient have two loose foul smelling bowel moving. Enteric precautions placed outside the door. Blood sugar was 65 at 1923. 25 ml of D50 given, repeat blood sugar at 1939 was 85. Dr. Joya Gaskins made aware of blood sugar. Orders received to change IV fluids to D5NS at 162ml/hr. Blood Sugar at 2050 was 103.  Attempts made to place new PIV x 2 stick unsuccessful. Order placed for IV team yet was not called until 2230. Prior to this spoke with Dr. Chase Caller regarding patients need for central line placement.  Georgann Housekeeper NP at bedside at 2200. Prior to placement, time out performed, attempt to call family unsuccessful. First site attempted under sterile procedure was the LIJ with no success. The RIJ was prepped and drapped and new central line was placed. Good blood return from all three ports. Placement confirmed by chest xray and Georgann Housekeeper NP. Patient was having several runs of bigeminy and short burst of VT. Labs drawn at 2340.  Patient coded at [place]- in sustained VT, no pulse present , CPR started. See Code sheet. Code concluded at [place] with return of rhythm and low blood pressure. See Doc Flowsheet. Georgann Housekeeper NP at bedside and contacted family- patient's sisters. Family made aware that patient was critical unstable and determined that if patient codes again that we would not intervene. Family was made aware when her return of vital signs.  Dr. Leonel Ramsay was at bedside at 0100. He was made aware of all the previous events. Patient placed on low dose diprivan and levophed.

## 2014-08-27 NOTE — Plan of Care (Signed)
Problem: Consults Goal: General Medical Patient Education See Patient Education Module for specific education. Outcome: Not Progressing Patient intubated with no family present at this time.  Goal: Skin Care Protocol Initiated - if Braden Score 18 or less If consults are not indicated, leave blank or document N/A Outcome: Progressing Goal: Nutrition Consult-if indicated Outcome: Progressing Goal: Diabetes Guidelines if Diabetic/Glucose > 140 If diabetic or lab glucose is > 140 mg/dl - Initiate Diabetes/Hyperglycemia Guidelines & Document Interventions  Outcome: Progressing

## 2014-08-27 NOTE — Procedures (Signed)
Indication:  This is a 53 yo female who is s/p cardiac arrest with coma and subsequent seizures.  The prolonged VEEG study is for further management of her intractable seizure. Medications include: Keppra, Dilantin, Propofol, Vncomycin, Xosyn, Peridex and NS IV.   Technique and Protocol: This is a standard 18-channel VEEG study utilizing international 10/20 system with both bipolar and monopolar montages.  Video source was available.  The study was performed at [facility] and read remotely at [facility].   Description of Findings: The VEEG study was started at 4:17 pm on 12/10 and ended at 7:30 am on 12/11  Total 15-hour EEG study was scanned in its entirety.  Video source was reviewed.  The baseline EEG background reached  low-voltage delta. During the first one hour of VEEG study, there is noted subclinical generalized status epilepticus, manifested with continues spike and waves at bilateral frontal/central region.  The event lasted about one hour in duration without major motor seizure per video review. There are intermittent generalized spikes and spike waves during the next 2 hours of study, during which about 3 transient generalized subclinical seizures lasting less than 1-2 minutes in duration.  The patient's seizure were controlled afterwards during the rest of 13-hour VEEG study with intermittent burst suppression pattern noted.  The EEG background was a more suppressed, low-voltage, 2-3 Hz, arrhythmic delta activity.  No major epileptifrom discharges were noted during the last 12 hours of VEEG study.   Impression: This is a severely abnormal 15-hour VEEG study.  It shows the presence of subclinical generalized status epilepticus during the first hour of the study.  The subclinical seizure was controlled during last 12 hours of VEEG study.  Clinical correlation is advised and recommend continuous VEEG study if it is still clinically indicated.

## 2014-08-27 NOTE — Progress Notes (Signed)
Obtained sputum specimen and sent to lab per MD order

## 2014-08-27 NOTE — Progress Notes (Signed)
Subjective: Patient had a recurrent cardiac arrest early this morning and recurrence of status epilepticus. Propofol was increased with no recurrence of seizure activity. EEG patterns consistent with burst suppression without epileptiform activity. Keppra and Vimpat up and continued.  Objective: Current vital signs: BP 98/56 mmHg  Pulse 55  Temp(Src) 100.2 F (37.9 C) (Oral)  Resp 24  Wt 52.5 kg (115 lb 11.9 oz)  SpO2 98%  Neurologic Exam: Intubated and on mechanical ventilation. She sedated with propofol and no reaction to peripheral noxious stimuli. Pupils react minimally to light bilaterally. Extraocular movements were absent with oculocephalic maneuvers. Corneal reflexes were intact and normal bilaterally. No facial asymmetry was noted. Muscle tone was flaccid throughout. She had no spontaneous movements of extremities nor withdrawal movements to noxious stimuli. She had no abnormal posturing. Deep tendon reflexes were trace only and lower extremities. Plantar responses were mute bilaterally.  Medications: I have reviewed the patient's current medications.  Assessment/Plan: 53 year old lady status post cardiac arrest with status epilepticus, with recurrent arrest earlier today swelling is recurrence of lines epileptiform activity. Anoxic encephalopathy is likely and is probably severe. Seizures are controlled at this point on current regimen of propofol plus Keppra 2000 mg every 12 hours and Vimpat 200 mg every 12 hours.  Recommend repeat CT scan of the head today to rule out changes of diffuse anoxic injury as well as possible focal stroke and cerebral edema.  We will continue to follow this patient closely with you.  C.R. Nicole Kindred, MD [facility] [phone]  08/27/2014  8:36 AM

## 2014-08-27 NOTE — Progress Notes (Signed)
eLink Physician-Brief Progress Note Patient Name: Alyssa Gray DOB: [birth year] MRN: [id number]   Date of Service  08/27/2014  HPI/Events of Note  k back from pre code 3  eICU Interventions  Repeat stat     Intervention Category Intermediate Interventions: Electrolyte abnormality - evaluation and management  Raylene Miyamoto. 08/27/2014, 2:33 AM

## 2014-08-27 NOTE — Progress Notes (Signed)
LTM Day 1 EEG uploaded for review. No skin breakdown noted.

## 2014-08-27 NOTE — Progress Notes (Signed)
PULMONARY / CRITICAL CARE MEDICINE   Name: Alyssa Gray MRN: [id number] DOB: August 27, 1961    ADMISSION DATE:  08/23/2014  REFERRING MD :  Dr. Laren Everts   CHIEF COMPLAINT:  Status Epilepticus post arrest   INITIAL PRESENTATION: 53 y/o F with PMH of CAD, CHF, HTN, Cocaine Abuse, PAD with mesenteric ischemia initially admitted to [facility] with abdominal pain and distention.  She underwent ex-lap on 11/17 for colonic mass resection.  Course complicated by CHF, R pleural effusion (s/p thora with 413ml off), and malnutrition on TPN.  She was tx to [facility] 11/24 for rehab.  On 12/9 was found unresponsive with initial rhythm of VF.  Required CPR for 35 minutes before ROSC.  Post arrest had witnessed seizures, started on Keppra, Dilantin & propofol but seizures continued.  PCCM consulted on 12/10 for evaluation and was transferred to [facility] ICU.    STUDIES:  12/10  TTE:  EF 25-30%, severely reduced systolic function, grade 1 DD 12/10  CT HEAD:    SIGNIFICANT EVENTS: 11/24  Admit to [facility]  12/09  VF cardiac arrest for 35 min before ROSC   SUBJECTIVE:  Propofol coma. Unresponsive  VITAL SIGNS: Temp:  [97.8 F (36.6 C)-100.2 F (37.9 C)] 98.6 F (37 C) (12/11 1108) Pulse Rate:  [28-152] 56 (12/11 1415) Resp:  [10-24] 24 (12/11 1415) BP: (57-204)/(12-109) 105/58 mmHg (12/11 1415) SpO2:  [85 %-100 %] 100 % (12/11 1415) FiO2 (%):  [40 %-100 %] 40 % (12/11 1129) Weight:  [52.5 kg (115 lb 11.9 oz)] 52.5 kg (115 lb 11.9 oz) (12/11 0427)  HEMODYNAMICS: CVP:  [2 mmHg-15 mmHg] 6 mmHg   VENTILATOR SETTINGS: Vent Mode:  [-] PRVC FiO2 (%):  [40 %-100 %] 40 % Set Rate:  [12 bmp-24 bmp] 24 bmp Vt Set:  [450 mL] 450 mL PEEP:  [5 cmH20] 5 cmH20 Plateau Pressure:  [7 cmH20-16 cmH20] 16 cmH20   INTAKE / OUTPUT:  Intake/Output Summary (Last 24 hours) at 08/27/14 1500 Last data filed at 08/27/14 1200  Gross per 24 hour  Intake 4360.35 ml  Output    750 ml  Net 3610.35 ml    PHYSICAL  EXAMINATION: General:  Intubated sedated comatose Neuro: RASS -5, no spont movement, no withdrawal  HEENT:  WNL Cardiovascular: RRR s M Lungs:  Clear Abdomen:  Soft, NT, +BS  Ext: cool, no edema   LABS: I have reviewed all of today's lab results. Relevant abnormalities are discussed in the A/P section  CXR: RLL infiltrate and volume loss  ASSESSMENT / PLAN:   NEUROLOGIC A:   Status Epilepticus  Acute Encephalopathy, post arrest P:   RASS goal: -4,-5 Cont Propofol coma AED therapy per Neurology  cEEG per Neuro Seizure precautions CT Head ordered - not yet done   PULMONARY OETT 12/9 >>  A: Acute Respiratory Failure post arrest.  Tobacco Abuse  RLL HCAP  P:   Cont full vent support - settings reviewed and/or adjusted Cont vent bundle Daily SBT if/when meets criteria  CARDIOVASCULAR CVL RUE PICC (?date) >>  R IJ CVL 12/11 >>  A:  VF Arrest - cardiac arrest on 12/9  CAD s/p Stent CHF - combined systolic / diastolic  HTN PVD Hx Cocaine Abuse P:  Hold anti-platelet / PVD rx until CT head to ensure no acute bleeding  Cardiac monitoring  RENAL A:   Hyponatremia, resolved Hypokalemia - corrected 12/11 Mild AKI - Cr improved 12/11  P:   Monitor BMET intermittently Monitor I/Os Correct  electrolytes as indicated  GASTROINTESTINAL A:   S/P Ex-Lap for Colon Mass - 11/17 at [facility]  P:   SUP: enteral famotidine Cont TFs as tolerated  HEMATOLOGIC A:   Mild anemia without overt bleeding  P:  DVT px: SQ heparin Monitor CBC intermittently Transfuse per usual ICU guidelines  INFECTIOUS A:   RLL HCAP  P:   Blood 12/10 >>  Urine 12/10 >> Resp 12/11 >>   Vanco 12/10 >>  Zosyn 12/10 >>    ENDOCRINE A:   Hyperglycemia   P:   SSI   FAMILY  - Updates: No family at bedside.   - Inter-disciplinary family meet or Palliative Care meeting due by:    30 mins CCM time  Merton Border, MD ; [facility] service Mobile [phone].   After 5:30 PM or weekends, call [phone]

## 2014-08-27 NOTE — Progress Notes (Signed)
CODE BLUE NOTE  Patient Name: Alyssa Gray   MRN: [id number]   Date of Birth/ Sex: April 28, 1961 , female      Admission Date: 09/07/2014  Attending Provider: Rush Farmer, MD  Primary Diagnosis: <principal problem not specified>    Indication: Pt was in her usual state of health until this AM, when she was noted to be V-fib. Code blue was subsequently called. At the time of arrival on scene, ACLS protocol was underway.    Technical Description:  - CPR performance duration:  17  minutes  - Was defibrillation or cardioversion used? Yes   - Was external pacer placed? No  - Was patient intubated pre/post CPR? Patient already intubated pre-CPR    Medications Administered: Y = Yes; Blank = No Amiodarone  Y  Atropine    Calcium    Epinephrine  Y  Lidocaine    Magnesium  Y  Norepinephrine    Phenylephrine    Sodium bicarbonate  Y  Vasopressin  Y    Post CPR evaluation:  - Final Status - Was patient successfully resuscitated ? Yes - What is current rhythm? NSR - What is current hemodynamic status? stable   Miscellaneous Information:  - Labs sent, including: Defer to primary  - Primary team notified?  Yes  - Family Notified? Yes  - Additional notes/ transfer status: Patient is now DNR        Janora Norlander, DO  PGY-1, [facility] 08/27/2014, 12:44 AM

## 2014-08-27 NOTE — Progress Notes (Signed)
NUTRITION FOLLOW-UP  Pt meets criteria for SEVERE MALNUTRITION in the context of acute illness as evidenced by severe fat and muscle depletion and 12% weight loss x 2 weeks.  DOCUMENTATION CODES Per approved criteria  -Severe malnutrition in the context of acute illness or injury -Underweight   INTERVENTION: Initiate Vital AF 1.2 @ 15 ml/hr via OG tube and increase by 10 ml every 12 hours to goal rate of 45 ml/hr. - will advance slowly as it is unclear what pt was receiving nutritionally prior to her [facility] admission.   Monitor magnesium, potassium, and phosphorus daily for at least 3 days, MD to replete as needed, as pt is at risk for refeeding syndrome given severe malnutrition.  Tube feeding regimen provides 1296 kcal, 81 grams of protein, and 875 ml of H2O.   TF regimen and propofol at current rate providing 1512 total kcal/day (101 % of kcal needs)  NUTRITION DIAGNOSIS: Inadequate oral intake related to inability to eat as evidenced by NPO status; ongoing.   Goal: Pt to meet >/= 90% of their estimated nutrition needs; not met.    Monitor:  Vent status, TF tolerance   ASSESSMENT: Pt with hx CAD s/p stent ( EF 25-30%), HTN, hx cocaine abuse PAD with mesenteric ischemia initially admitted to [facility] with abd pain/distention. Ultimately underwent exploratory lap 11/17 for colonic mass resection. Course c/b CHF, R pleural effusion (s/p thoracentesis with 451m off), and malnutrition on TPN. Was tx 11/24 to [facility] for rehab. On 12/9 was found unresponsive, initial rhythm VFib at 0400 hours. Had CPR for total 35 minutes before ROSC. Pt with seizures post arrest.   Patient is currently intubated on ventilator support MV: 11.1 L/min Temp (24hrs), Avg:98.6 F (37 C), Min:97.8 F (36.6 C), Max:100.2 F (37.9 C)  Propofol: 8.2 ml/hr providing 216 kcal per day from lipid  Pt s/p second arrest early this am. Pt on Propofol. TF: Vital AF 1.2 @ 15 ml/hr and  increasing by 10 ml every 12 hours.  [facility] Per SLP pt started Pureed diet 12/7 and advanced to Regular on 12/8 but was not eating well.  Potassium low, Magnesium elevated.   Height: Ht Readings from Last 1 Encounters:  08/11/14 5' 5"  (1.651 m)    Weight: Wt Readings from Last 1 Encounters:  08/27/14 115 lb 11.9 oz (52.5 kg)  Admission weight: 106 lb (48.5 kg) 12/10  BMI:  Body mass index is 19.26 kg/(m^2).  Estimated Nutritional Needs: Kcal: 1497 Protein: 75-100 grams Fluid: > 1.5 L/day  Skin: abd incision  Diet Order:     Intake/Output Summary (Last 24 hours) at 08/27/14 1045 Last data filed at 08/27/14 1000  Gross per 24 hour  Intake 4174.76 ml  Output    750 ml  Net 3424.76 ml    Last BM: 12/10   Labs:   Recent Labs Lab 08/31/2014 1645 08/23/2014 2342 08/27/14 0239  NA 133* 131* 136*  K 3.6* 3.0* 3.2*  CL 100 98 102  CO2 20 19 18*  BUN 20 20 21   CREATININE 1.18* 1.11* 1.12*  CALCIUM 7.5* 7.3* 6.8*  MG  --  1.8 3.5*  PHOS  --  3.7 3.9  GLUCOSE 73 209* 162*    CBG (last 3)   Recent Labs  09/03/2014 2325 08/27/14 0327 08/27/14 0725  GLUCAP 135* 125* 128*    Scheduled Meds: . antiseptic oral rinse  7 mL Mouth Rinse QID  . chlorhexidine  15 mL Mouth  Rinse BID  . famotidine  20 mg Per Tube BID  . heparin subcutaneous  5,000 Units Subcutaneous 3 times per day  . lacosamide (VIMPAT) IV  200 mg Intravenous Q12H  . levETIRAcetam  2,000 mg Intravenous Q12H  . phenytoin (DILANTIN) IV  100 mg Intravenous 3 times per day  . piperacillin-tazobactam (ZOSYN)  IV  3.375 g Intravenous Q8H  . potassium chloride  40 mEq Per Tube Once  . vancomycin  1,000 mg Intravenous Q24H    Continuous Infusions: . amiodarone    . feeding supplement (VITAL AF 1.2 CAL) 1,000 mL (08/27/14 0804)  . norepinephrine (LEVOPHED) Adult infusion 8 mcg/min (08/27/14 0802)  . propofol 25 mcg/kg/min (08/27/14 0507)    [place], [place], [place]  Pager [phone] After Hours Pager

## 2014-08-27 NOTE — Progress Notes (Signed)
UR Completed.  Vergie Living [phone] 08/27/2014

## 2014-08-28 ENCOUNTER — Encounter (HOSPITAL_COMMUNITY): Payer: Self-pay | Admitting: Critical Care Medicine

## 2014-08-28 ENCOUNTER — Inpatient Hospital Stay (HOSPITAL_COMMUNITY): Payer: Medicare Other

## 2014-08-28 DIAGNOSIS — Z66 Do not resuscitate: Secondary | ICD-10-CM

## 2014-08-28 DIAGNOSIS — Z515 Encounter for palliative care: Secondary | ICD-10-CM

## 2014-08-28 LAB — BASIC METABOLIC PANEL
Anion gap: 16 — ABNORMAL HIGH (ref 5–15)
BUN: 21 mg/dL (ref 6–23)
CO2: 17 mEq/L — ABNORMAL LOW (ref 19–32)
CREATININE: 1.23 mg/dL — AB (ref 0.50–1.10)
Calcium: 7.4 mg/dL — ABNORMAL LOW (ref 8.4–10.5)
Chloride: 103 mEq/L (ref 96–112)
GFR calc Af Amer: 57 mL/min — ABNORMAL LOW (ref >90)
GFR, EST NON AFRICAN AMERICAN: 49 mL/min — AB (ref >90)
Glucose, Bld: 109 mg/dL — ABNORMAL HIGH (ref 70–99)
Potassium: 3.7 mEq/L (ref 3.7–5.3)
Sodium: 136 mEq/L — ABNORMAL LOW (ref 137–147)

## 2014-08-28 LAB — CBC
HEMATOCRIT: 30.8 % — AB (ref 36.0–46.0)
HEMOGLOBIN: 10.3 g/dL — AB (ref 12.0–15.0)
MCH: 28.7 pg (ref 26.0–34.0)
MCHC: 33.4 g/dL (ref 30.0–36.0)
MCV: 85.8 fL (ref 78.0–100.0)
Platelets: 452 10*3/uL — ABNORMAL HIGH (ref 150–400)
RBC: 3.59 MIL/uL — ABNORMAL LOW (ref 3.87–5.11)
RDW: 15.3 % (ref 11.5–15.5)
WBC: 9.2 10*3/uL (ref 4.0–10.5)

## 2014-08-28 LAB — DRUG SCREEN PANEL (SERUM)

## 2014-08-28 LAB — GLUCOSE, CAPILLARY
GLUCOSE-CAPILLARY: 111 mg/dL — AB (ref 70–99)
Glucose-Capillary: 142 mg/dL — ABNORMAL HIGH (ref 70–99)
Glucose-Capillary: 70 mg/dL (ref 70–99)

## 2014-08-28 MED ORDER — CEFTRIAXONE SODIUM IN DEXTROSE 20 MG/ML IV SOLN
1.0000 g | INTRAVENOUS | Status: DC
  Administered 2014-08-28: 1 g via INTRAVENOUS
  Filled 2014-08-28 (×2): qty 50

## 2014-08-28 MED ORDER — LORAZEPAM 2 MG/ML IJ SOLN
5.0000 mg/h | INTRAMUSCULAR | Status: DC
  Administered 2014-08-28: 5 mg/h via INTRAVENOUS
  Filled 2014-08-28: qty 25

## 2014-08-28 MED ORDER — MORPHINE BOLUS VIA INFUSION
5.0000 mg | INTRAVENOUS | Status: DC | PRN
  Filled 2014-08-28: qty 20

## 2014-08-28 MED ORDER — LORAZEPAM 2 MG/ML IJ SOLN
INTRAMUSCULAR | Status: AC
  Filled 2014-08-28: qty 2

## 2014-08-28 MED ORDER — MORPHINE SULFATE 25 MG/ML IV SOLN
10.0000 mg/h | INTRAVENOUS | Status: DC
  Administered 2014-08-28: 10 mg/h via INTRAVENOUS
  Filled 2014-08-28: qty 10

## 2014-08-28 MED ORDER — LORAZEPAM BOLUS VIA INFUSION
2.0000 mg | INTRAVENOUS | Status: DC | PRN
  Filled 2014-08-28: qty 5

## 2014-08-28 MED FILL — Medication: Qty: 1 | Status: AC

## 2014-08-29 LAB — CULTURE, RESPIRATORY

## 2014-09-01 LAB — CULTURE, BLOOD (ROUTINE X 2): CULTURE: NO GROWTH

## 2014-09-01 LAB — LEVETIRACETAM LEVEL: Levetiracetam Lvl: 61.4 ug/mL

## 2014-09-04 MED FILL — Medication: Qty: 1 | Status: AC

## 2014-09-17 NOTE — Procedures (Signed)
Indication:  This is a 54 yo female who is s/p cardiac arrest with coma and subsequent seizures.  The prolonged VEEG study is for further management of her seizure. Medications include: Keppra, Dilantin, Propofol, Vncomycin, Xosyn, Peridex and NS IV.   Technique and Protocol: This is a standard 18-channel VEEG study utilizing international 10/20 system with both bipolar and monopolar montages.  Video source was available.  The patient was intubated under mechanic ventilator.  The study was performed at [facility] and read remotely at [facility].   Description of Findings: The VEEG study was started at 7:30 am on 12/11 and ended at 7:30 am on 12/12  Total 24-hour EEG study was scanned in its entirety.  This is day 2 of the continuous VEEG study.  Video source was sampled.  The baseline EEG background reached 4-5 Hz theta.  There were abundant arrhythmic delta activity noted during the VEEG study.  There were improved EEG background compared to the study one day prior.  Between 2pm and 5pm on 12/11, there were several transient subclinical generalized seizures lasting less than one minutes.  Those events are manifested with higher voltage rhythmic theta/delta and spike wave activity.  There were prominent, frequent spike/sharps with rhythmic/semi-rhythmic delta activity at right frontal and central region between 3:24 and 4:20 pm on 12/11.  Those events were consistent with subclinical seizures. No motor seizure per video review. Subclinical seizures were much more controlled after 5pm on 12/11 with only occasional, transient, generalized, subclinical seizure during the last 12 hours of the study.  Those seizure events lasted no more than 10-20 seconds in duration.  Occasional isolated generalized spike/spike and waves were also noted during the last 12 hours.   Impression: This is a severely abnormal 24-hour VEEG study.  It shows the presence of intermittent subclinical generalized seizures  during the study.  The seizure events were much more controlled during the last 12 hours with only occasional transient events noted.  Clinical correlation is advised and recommend continual AEDs management.  The findings were discussed with primary neurologist, Dr. Nicole Kindred.

## 2014-09-17 NOTE — Progress Notes (Signed)
LTM EEG taken down per Dr. Nicole Kindred. No skin breakdown seen.

## 2014-09-17 NOTE — Progress Notes (Signed)
[place] Progress Note Patient Name: Alyssa Gray DOB: 01/07/1961 MRN: [id number]   Date of Service  09/06/2014  HPI/Events of Note  RN reports change in neuro status with loss of some brain stem reflexes.  eICU Interventions  Will get stat CT head w/o contrast.     Intervention Category Major Interventions: Other:  Skylee Baird Sep 06, 2014, 1:26 AM

## 2014-09-17 NOTE — Progress Notes (Signed)
Patient ID: Alyssa Gray, female   DOB: 1961-04-17, 54 y.o.   MRN: [id number] Extensive discussion with family sisters. We discussed the poor prognosis and likely poor quality of life. Family has decided to offer full comfort care. They are aware that the patient may be transferred to palliative care floor for continued comfort care needs. They have been fully updated on the process and expectations. Also now full ncb  Lavon Paganini. Titus Mould, MD, [place] Pgr: [place] [facility]

## 2014-09-17 NOTE — Progress Notes (Signed)
Pt pronounced deceased by 2  RNs . E-Link Dr informed.

## 2014-09-17 NOTE — Progress Notes (Signed)
PULMONARY / CRITICAL CARE MEDICINE   Name: Alyssa Gray MRN: [id number] DOB: 1961-06-26    ADMISSION DATE:  08/27/2014  REFERRING MD :  Dr. Laren Everts   CHIEF COMPLAINT:  Status Epilepticus post arrest   INITIAL PRESENTATION: 54 y/o F with PMH of CAD, CHF, HTN, Cocaine Abuse, PAD with mesenteric ischemia initially admitted to [facility] with abdominal pain and distention.  She underwent ex-lap on 11/17 for colonic mass resection.  Course complicated by CHF, R pleural effusion (s/p thora with 475ml off), and malnutrition on TPN.  She was tx to [facility] 11/24 for rehab.  On 12/9 was found unresponsive with initial rhythm of VF.  Required CPR for 35 minutes before ROSC.  Post arrest had witnessed seizures, started on Keppra, Dilantin & propofol but seizures continued.  PCCM consulted on 12/10 for evaluation and was transferred to [facility] ICU.    STUDIES:  12/10  TTE:  EF 25-30%, severely reduced systolic function, grade 1 DD 12/9  CT HEAD: neg 12/11 eeg>>>It shows the presence of subclinical generalized status epilepticus during the first hour of the study. The subclinical seizure was controlled during last 12 hours of VEEG study.  SIGNIFICANT EVENTS: 11/24  Admit to [facility]  12/09  VF cardiac arrest for 35 min before ROSC 12/11 worsening neurostatus, eeg ordered  SUBJECTIVE:  Remains on propofol  VITAL SIGNS: Temp:  [97.7 F (36.5 C)-99.1 F (37.3 C)] 97.8 F (36.6 C) (12/12 0400) Pulse Rate:  [53-100] 57 (12/12 0600) Resp:  [14-24] 21 (12/12 0600) BP: (91-164)/(52-105) 110/56 mmHg (12/12 0600) SpO2:  [100 %] 100 % (12/12 0600) FiO2 (%):  [40 %] 40 % (12/12 0414) Weight:  [53.2 kg (117 lb 4.6 oz)] 53.2 kg (117 lb 4.6 oz) (12/12 0420)  HEMODYNAMICS: CVP:  [6 mmHg] 6 mmHg   VENTILATOR SETTINGS: Vent Mode:  [-] PRVC FiO2 (%):  [40 %] 40 % Set Rate:  [24 bmp] 24 bmp Vt Set:  [450 mL] 450 mL PEEP:  [5 cmH20] 5 cmH20 Plateau Pressure:  [14 cmH20-16 cmH20] 14 cmH20   INTAKE /  OUTPUT:  Intake/Output Summary (Last 24 hours) at 09-02-14 0830 Last data filed at September 02, 2014 0600  Gross per 24 hour  Intake 2814.59 ml  Output   1000 ml  Net 1814.59 ml    PHYSICAL EXAMINATION: General:  Slight reaction pain left arm Neuro: bibinski wnl, not following commands, no gag, but on propofol HEENT:  WNL Cardiovascular: RRR s M Lungs:  CTA Abdomen:  Soft, NT, +BS  Ext: cool, yes uppers edema   LABS: I have reviewed all of today's lab results. Relevant abnormalities are discussed in the A/P section  CXR: RLL infiltrate and volume loss  ASSESSMENT / PLAN:   NEUROLOGIC A:   Status Epilepticus  Acute Encephalopathy, post arrest P:   RASS goal: 0 Dc Propofol and re examine AED therapy per Neurology  cEEG per Neuro- continuous  Seizure precautions CT Head ordered- im am unclear if this is gonna add much , will re examaine and speak with neurology Will call sisters to consider comfort care as worsening neurostatus, multiple arrests, prolonged regardless of status that has cleared on continous study  PULMONARY OETT 12/9 >>  A: Acute Respiratory Failure post arrest.  Tobacco Abuse  RLL HCAP -imrpoved atx P:   abg reviewed, keep same MV Wean attempt ok sbt cpap5 ps 5-10 goal 30 min, no extubation planned  CARDIOVASCULAR CVL RUE PICC (?date) >>  R IJ CVL 12/11 >>  A:  VF Arrest - cardiac arrest on 12/9  CAD s/p Stent CHF - combined systolic / diastolic  HTN PVD Hx Cocaine Abuse P:   Cardiac monitoring Levophed off, map ok, goal 60 Amio, may need to hold if HR drops further  RENAL A:   Hyponatremia, resolved Hypokalemia - corrected 12/11 Mild AKI - Cr improved 12/11  P:   KVO No role bicarb  GASTROINTESTINAL A:   S/P Ex-Lap for Colon Mass - 11/17 at [place]:   SUP: enteral famotidine Cont TFs as tolerated  HEMATOLOGIC A:   Mild anemia without overt bleeding  P:  DVT px: SQ heparin Monitor CBC  intermittently Transfuse would not change outcome  INFECTIOUS A:   RLL HCAP  P:   Blood 12/10 >>  Urine 12/10 >> Resp 12/11 >>   Vanco 12/10 >> 12/12 Zosyn 12/10 >> 12/12 Ceftriaxone 12/12>>>  Narrow as above as culture neg   ENDOCRINE A:   Hyperglycemia   P:   SSI   FAMILY  - Updates: No family at bedside.   - Inter-disciplinary family meet or Palliative Care meeting due by:    30 mins CCM time  Lavon Paganini. Titus Mould, MD, [place] Pgr: [place] [facility]

## 2014-09-17 NOTE — Progress Notes (Signed)
Notified MD pt opening eyes and withdrawing limbs to painful stimuli. Pt previously unresponsive. No cough, no gag and no response to confrontation. Will continue to monitor. New orders received.

## 2014-09-17 NOTE — Progress Notes (Signed)
Dr Titus Mould conversing over the phone w/Pt's sister---Judy Veverly Fells .

## 2014-09-17 NOTE — Procedures (Signed)
Extubation Procedure Note  Patient Details:   Name: Alyssa Gray DOB: [birth year] MRN: [id number]   Airway Documentation:     Evaluation  O2 sats: currently acceptable Complications: No apparent complications Patient did tolerate procedure well. Bilateral Breath Sounds: Clear Suctioning: Airway No  Pt placed on CPAP/PS 5/5 to evaluate her breathing & the patient was apneic, pt extubated to ra per protocol.   Cordella Register 22-Sep-2014, 3:12 PM

## 2014-09-17 NOTE — Progress Notes (Signed)
Pt in no apparent distress. See VS section .

## 2014-09-17 NOTE — Progress Notes (Signed)
Gretta Began ,sister, informed of the Pt's demise  .

## 2014-09-17 NOTE — Accreditation Note (Signed)
Pt tol extubation well . No signs of distress . Ativan and morphine gtts infusing .

## 2014-09-17 NOTE — Progress Notes (Addendum)
Tania Ade ,sister ,notified of Pt's demise . Ms. Savino will notify Constance Haw and [facility] .

## 2014-09-17 DEATH — deceased

## 2014-09-20 NOTE — Discharge Summary (Signed)
NAME:  Alyssa Gray, Alyssa Gray NO.:  [id number]  MEDICAL RECORD NO.:  [id number]  LOCATION:                                 FACILITY:  PHYSICIAN:  Raylene Miyamoto, MD DATE OF BIRTH:  July 14, 1961  DATE OF ADMISSION:  08/29/2014 DATE OF DISCHARGE:  2014-09-23                              DISCHARGE SUMMARY   DEATH SUMMARY  This is a 54 year old female with a past medical history of coronary artery disease, CHF, hypertension, cocaine abuser, peripheral artery disease, and mesenteric ischemia initially admitted to the care, [facility] with abdominal pain and distention.  She underwent exploratory laparotomy on August 03, 2014, from colonic mass resection.  Her course was complicated by heart failure, right pleural effusion, status post thoracentesis, as well as malnutrition on TPN. She was transferred to the [facility] on  November, 24, 2015, for rehab.  On August 25, 2014, the patient was found unresponsive with initial rhythm of ventricular fibrillation, required CPR for 35 minutes before return of spontaneous circulation.  Post arrest, had witnessed seizures and started on Keppra, Dilantin, or propofol, but seizures continued.  Eventually, Pulmonary Critical Care was consulted on August 26, 2014, for evaluation and was transferred to the [facility] for continued post cardiac arrest care and status epilepticus concerns.  On August 26, 2014, the patient had echo which showed an ejection fraction of 25% to 30% with severely reduced systolic function, grade 1 diastolic dysfunction.  On August 25, 2014, the patient had a CT scan of the head which was negative.  On August 27, 2014, EEG showed the presence of subclinical generalized status epilepticus during the first hour of the study.  Subclinical seizures was controlled during the 12 hours of the continuous EEG study.  The patient was also remained on  propofol. Discussions were had with updates to the patient's medical power of attorney, sisters, post-arrest patient's severe brain injury, post control of seizures, patient continued to have severe encephalopathy and a horrible prognosis for quality of life recovery.  Bottom line is that the family opted after understanding that her quality life would be horrific as far as having improved functional status.  Neurology consultation, of course had been on board for several days and also agreed with this poor prognosis, comfort care was provided. Discontinuation of life support and the patient expired.  FINAL DIAGNOSES UPON DEATH: 1. 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 2. Status epilepticus secondary to prolonged cardiac arrest. 3. Acute respiratory failure. 4. Status post cardiac arrest, prolonged. 5. Hospital-acquired pneumonia with atelectasis. 6. Cocaine abuse. 7. Heart failure, combined systolic and diastolic, acute. 8. Status post exploratory laparotomy for colon mass.     Raylene Miyamoto, MD     DJF/MEDQ  D:  09/17/2014  T:  09/18/2014  Job:  [id number]

## 2016-11-08 IMAGING — CR DG CHEST 1V PORT
1 series · 1 of 1 positions shown · non-contrast
Comparison: 08/27/2014

CLINICAL DATA: Acute respiratory failure.  On ventilator.

EXAM:
PORTABLE CHEST - 1 VIEW

[AP]
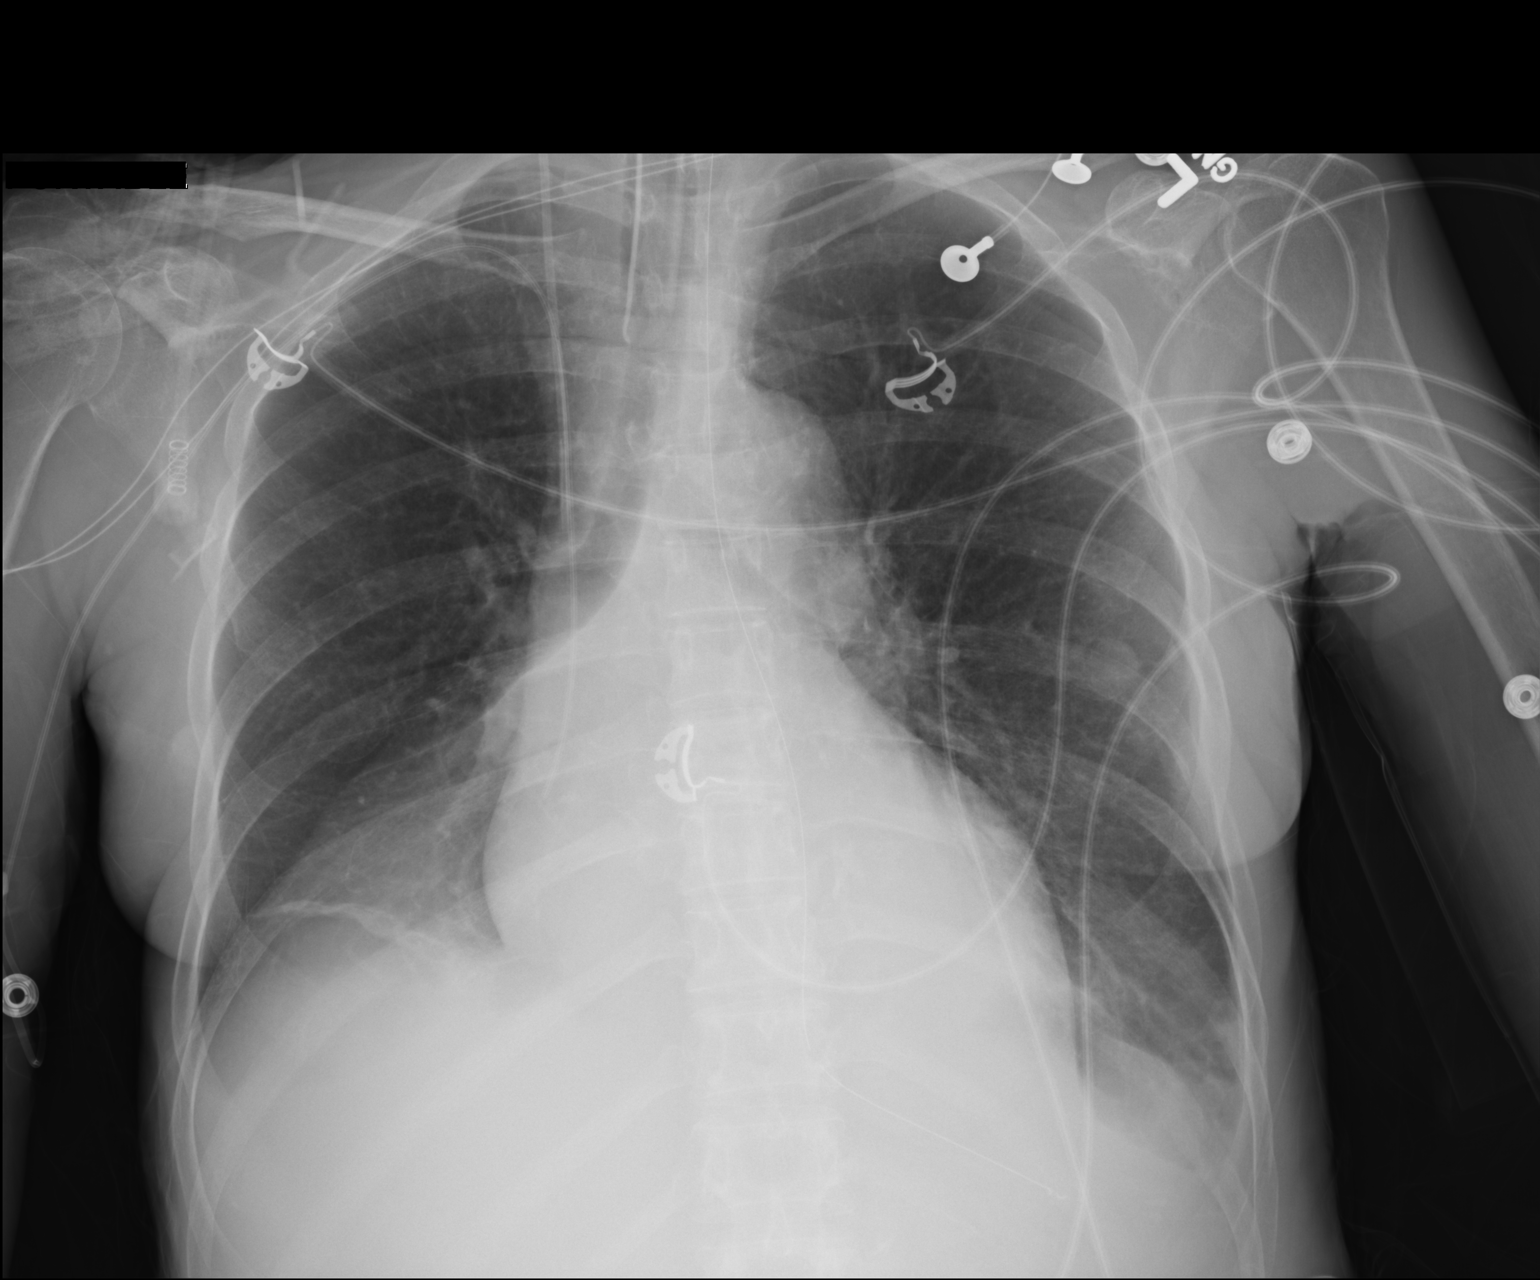

[1 of 1 positions shown; findings below may reference images not displayed]

FINDINGS: Support lines and tubes in appropriate position. Persistent
bilateral lower lobe consolidation is seen as well as small
bilateral pleural effusions. This shows no significant change
compared to prior study. Heart size is normal. Nipple shadows again
seen projecting over the lower lung zones.
IMPRESSION: Bilateral lower lobe consolidation and probable small bilateral
pleural effusions, without significant change.
